# Patient Record
Sex: Female | Born: 1955
Health system: Southern US, Community
[De-identification: ages and names within clinical notes are randomized; demographics above are authoritative.]

## PROBLEM LIST (undated history)

## (undated) DIAGNOSIS — M51369 Other intervertebral disc degeneration, lumbar region without mention of lumbar back pain or lower extremity pain: Secondary | ICD-10-CM

## (undated) DIAGNOSIS — M549 Dorsalgia, unspecified: Secondary | ICD-10-CM

## (undated) DIAGNOSIS — K219 Gastro-esophageal reflux disease without esophagitis: Secondary | ICD-10-CM

## (undated) DIAGNOSIS — M5136 Other intervertebral disc degeneration, lumbar region: Secondary | ICD-10-CM

## (undated) DIAGNOSIS — Z923 Personal history of irradiation: Secondary | ICD-10-CM

## (undated) DIAGNOSIS — F419 Anxiety disorder, unspecified: Secondary | ICD-10-CM

## (undated) DIAGNOSIS — C50119 Malignant neoplasm of central portion of unspecified female breast: Secondary | ICD-10-CM

## (undated) DIAGNOSIS — G8929 Other chronic pain: Secondary | ICD-10-CM

## (undated) DIAGNOSIS — J45909 Unspecified asthma, uncomplicated: Secondary | ICD-10-CM

## (undated) HISTORY — PX: BLADDER SUSPENSION: SHX72

## (undated) HISTORY — PX: ABDOMINAL HYSTERECTOMY: SHX81

## (undated) HISTORY — DX: Unspecified asthma, uncomplicated: J45.909

## (undated) HISTORY — DX: Other intervertebral disc degeneration, lumbar region: M51.36

## (undated) HISTORY — DX: Malignant neoplasm of central portion of unspecified female breast: C50.119

## (undated) HISTORY — DX: Gastro-esophageal reflux disease without esophagitis: K21.9

## (undated) HISTORY — PX: ENDOMETRIAL ABLATION: SHX621

## (undated) HISTORY — DX: Other intervertebral disc degeneration, lumbar region without mention of lumbar back pain or lower extremity pain: M51.369

---

## 1999-11-03 ENCOUNTER — Other Ambulatory Visit: Admission: RE | Admit: 1999-11-03 | Discharge: 1999-11-03 | Payer: Self-pay | Admitting: Gynecology

## 1999-12-08 ENCOUNTER — Encounter: Admission: RE | Admit: 1999-12-08 | Discharge: 1999-12-08 | Payer: Self-pay | Admitting: Gynecology

## 1999-12-08 ENCOUNTER — Encounter: Payer: Self-pay | Admitting: Gynecology

## 2000-01-02 ENCOUNTER — Other Ambulatory Visit: Admission: RE | Admit: 2000-01-02 | Discharge: 2000-01-02 | Payer: Self-pay | Admitting: Gynecology

## 2000-01-22 ENCOUNTER — Other Ambulatory Visit: Admission: RE | Admit: 2000-01-22 | Discharge: 2000-01-22 | Payer: Self-pay | Admitting: Gynecology

## 2000-01-22 ENCOUNTER — Encounter (INDEPENDENT_AMBULATORY_CARE_PROVIDER_SITE_OTHER): Payer: Self-pay | Admitting: Specialist

## 2000-11-20 ENCOUNTER — Other Ambulatory Visit: Admission: RE | Admit: 2000-11-20 | Discharge: 2000-11-20 | Payer: Self-pay | Admitting: Gynecology

## 2000-12-23 ENCOUNTER — Encounter: Payer: Self-pay | Admitting: Gynecology

## 2000-12-23 ENCOUNTER — Encounter: Admission: RE | Admit: 2000-12-23 | Discharge: 2000-12-23 | Payer: Self-pay | Admitting: Gynecology

## 2001-05-29 ENCOUNTER — Other Ambulatory Visit: Admission: RE | Admit: 2001-05-29 | Discharge: 2001-05-29 | Payer: Self-pay | Admitting: Gynecology

## 2001-09-16 ENCOUNTER — Encounter: Payer: Self-pay | Admitting: *Deleted

## 2001-09-16 ENCOUNTER — Encounter: Admission: RE | Admit: 2001-09-16 | Discharge: 2001-09-16 | Payer: Self-pay | Admitting: *Deleted

## 2001-12-11 ENCOUNTER — Ambulatory Visit (HOSPITAL_BASED_OUTPATIENT_CLINIC_OR_DEPARTMENT_OTHER): Admission: RE | Admit: 2001-12-11 | Discharge: 2001-12-11 | Payer: Self-pay | Admitting: Gynecology

## 2001-12-11 ENCOUNTER — Encounter (INDEPENDENT_AMBULATORY_CARE_PROVIDER_SITE_OTHER): Payer: Self-pay | Admitting: Specialist

## 2001-12-24 ENCOUNTER — Encounter: Payer: Self-pay | Admitting: Gynecology

## 2001-12-24 ENCOUNTER — Encounter: Admission: RE | Admit: 2001-12-24 | Discharge: 2001-12-24 | Payer: Self-pay | Admitting: Gynecology

## 2002-10-19 ENCOUNTER — Other Ambulatory Visit: Admission: RE | Admit: 2002-10-19 | Discharge: 2002-10-19 | Payer: Self-pay | Admitting: Gynecology

## 2002-12-28 ENCOUNTER — Encounter: Admission: RE | Admit: 2002-12-28 | Discharge: 2002-12-28 | Payer: Self-pay | Admitting: Gynecology

## 2002-12-28 ENCOUNTER — Encounter: Payer: Self-pay | Admitting: Gynecology

## 2003-12-29 ENCOUNTER — Encounter: Admission: RE | Admit: 2003-12-29 | Discharge: 2003-12-29 | Payer: Self-pay | Admitting: Gynecology

## 2004-05-15 ENCOUNTER — Other Ambulatory Visit: Admission: RE | Admit: 2004-05-15 | Discharge: 2004-05-15 | Payer: Self-pay | Admitting: Gynecology

## 2004-06-06 ENCOUNTER — Encounter: Admission: RE | Admit: 2004-06-06 | Discharge: 2004-07-03 | Payer: Self-pay | Admitting: Orthopedic Surgery

## 2004-08-14 ENCOUNTER — Ambulatory Visit (HOSPITAL_COMMUNITY): Admission: RE | Admit: 2004-08-14 | Discharge: 2004-08-14 | Payer: Self-pay | Admitting: Chiropractic Medicine

## 2005-01-19 ENCOUNTER — Encounter: Admission: RE | Admit: 2005-01-19 | Discharge: 2005-01-19 | Payer: Self-pay | Admitting: Gynecology

## 2005-09-27 ENCOUNTER — Other Ambulatory Visit: Admission: RE | Admit: 2005-09-27 | Discharge: 2005-09-27 | Payer: Self-pay | Admitting: Gynecology

## 2006-01-21 ENCOUNTER — Encounter: Admission: RE | Admit: 2006-01-21 | Discharge: 2006-01-21 | Payer: Self-pay | Admitting: Gynecology

## 2006-07-31 ENCOUNTER — Other Ambulatory Visit: Admission: RE | Admit: 2006-07-31 | Discharge: 2006-07-31 | Payer: Self-pay | Admitting: Gynecology

## 2007-01-28 ENCOUNTER — Encounter: Admission: RE | Admit: 2007-01-28 | Discharge: 2007-01-28 | Payer: Self-pay | Admitting: Gynecology

## 2009-01-14 ENCOUNTER — Encounter: Admission: RE | Admit: 2009-01-14 | Discharge: 2009-01-14 | Payer: Self-pay | Admitting: Internal Medicine

## 2010-02-28 ENCOUNTER — Encounter: Admission: RE | Admit: 2010-02-28 | Discharge: 2010-02-28 | Payer: Self-pay | Admitting: Gynecology

## 2010-10-08 ENCOUNTER — Encounter: Payer: Self-pay | Admitting: Internal Medicine

## 2011-01-22 ENCOUNTER — Ambulatory Visit (HOSPITAL_BASED_OUTPATIENT_CLINIC_OR_DEPARTMENT_OTHER)
Admission: RE | Admit: 2011-01-22 | Discharge: 2011-01-23 | Disposition: A | Discharge status: Home / Self Care | Payer: BC Managed Care – PPO | Source: Ambulatory Visit | Attending: Gynecology | Admitting: Gynecology

## 2011-01-22 ENCOUNTER — Other Ambulatory Visit: Payer: Self-pay | Admitting: Gynecology

## 2011-01-22 DIAGNOSIS — Z79899 Other long term (current) drug therapy: Secondary | ICD-10-CM | POA: Insufficient documentation

## 2011-01-22 DIAGNOSIS — R110 Nausea: Secondary | ICD-10-CM | POA: Insufficient documentation

## 2011-01-22 DIAGNOSIS — N8189 Other female genital prolapse: Secondary | ICD-10-CM | POA: Insufficient documentation

## 2011-01-22 DIAGNOSIS — N815 Vaginal enterocele: Secondary | ICD-10-CM | POA: Insufficient documentation

## 2011-01-22 DIAGNOSIS — N814 Uterovaginal prolapse, unspecified: Secondary | ICD-10-CM | POA: Insufficient documentation

## 2011-01-22 DIAGNOSIS — Z01812 Encounter for preprocedural laboratory examination: Secondary | ICD-10-CM | POA: Insufficient documentation

## 2011-01-22 LAB — DIFFERENTIAL
Basophils Absolute: 0 10*3/uL (ref 0.0–0.1)
Eosinophils Relative: 0 % (ref 0–5)
Lymphocytes Relative: 5 % — ABNORMAL LOW (ref 12–46)
Neutro Abs: 10.9 10*3/uL — ABNORMAL HIGH (ref 1.7–7.7)

## 2011-01-22 LAB — CBC
HCT: 36.7 % (ref 36.0–46.0)
Hemoglobin: 13.1 g/dL (ref 12.0–15.0)
MCH: 30.7 pg (ref 26.0–34.0)
MCHC: 35.7 g/dL (ref 30.0–36.0)
Platelets: 326 10*3/uL (ref 150–400)

## 2011-01-22 LAB — POCT HEMOGLOBIN-HEMACUE: Hemoglobin: 14.3 g/dL (ref 12.0–15.0)

## 2011-02-02 NOTE — Op Note (Signed)
[facility]  Patient:    Stephanie Malone, Stephanie Malone Visit Number: [id number] MRN: [id number]          Service Type: NES Location: [facility] Attending Physician:  Katrina Stack Dictated by:   Gretta Cool, M.D. Proc. Date: 12/11/01 Admit Date:  12/11/2001                             Operative Report  PREOPERATIVE DIAGNOSIS:  Abdominal ultrasound with polyp versus fibroid luminal lesion.  POSTOPERATIVE DIAGNOSIS:  Submucous leiomyoma with abdominal ultrasound.  PROCEDURES: 1. Hysteroscopy. 2. Resection of an anterior fundal fibroid. 3. Total endometrial ablation by resection. 4. VaporTrode.  SURGEON:  Gretta Cool, M.D.  ANESTHESIA:  MAC plus paracervical block.  DESCRIPTION OF PROCEDURE:  Under excellent MAC anesthesia with the patient prepped and draped in Allen stirrups, a weighted speculum was placed in the vagina and the cervix grasped with a single-tooth tenaculum.  It was progressively dilated with a series of Pratt dilators to accommodate a 7 mm resectoscope.  The entire cavity was then examined and photographed.  After identification of what appeared to be an anterior wall fibroid at the apex of the fundus, the decision was made to resect it if at all possible.  There was one other small polypoid-looking structure near the left corneal area.  The bulging into the cavity was incised with the 90 degree loop and identified as a fibroid that could easily be removed by hysteroscopy.  It was resected out of the capsule.  The entire endometrial cavity was then symptomatically treated by resection and then by VaporTrode so as to eliminate any viable islands of endocardial tissue.  At the end of the procedure, the cavity was felt to have been optimally resected.  There was no evidence of deep adenomyosis or other lesions bulging into the cavity.  At this point, the procedure was terminated without complication.  The patient returned to  the recovery room in excellent condition. Dictated by:   Gretta Cool, M.D. Attending Physician:  Katrina Stack DD:  12/11/01 TD:  12/11/01 Job: [id number]

## 2011-03-02 NOTE — Op Note (Signed)
[name], Malone                 ACCOUNT NO.:  [id number]  MEDICAL RECORD NO.:  [id number]          PATIENT TYPE:  LOCATION:                                 FACILITY:  PHYSICIAN:  Gretta Cool, M.D. DATE OF BIRTH:  [birth year]  DATE OF PROCEDURE:  01/22/2011 DATE OF DISCHARGE:                              OPERATIVE REPORT   PREOPERATIVE DIAGNOSIS:  Pelvic organ prolapse with uterine prolapse grade 3 with rectocele and enterocele grade 3.  POSTOPERATIVE DIAGNOSIS:  Pelvic organ prolapse with uterine prolapse grade 3 with rectocele and enterocele grade 3.  PROCEDURE:  Vaginal hysterectomy posterior and enterocele repairs, cardinal uterosacral colposuspension.  SURGEON:  Gretta Cool, M.D.  ASSISTANT:  Lodema Hong.  ANESTHESIA:  General orotracheal.  DESCRIPTION OF PROCEDURE:  Under excellent anesthesia by LMA, this patient was prepped and draped in Allen stirrups in the lithotomy position.  The Foley catheter was placed in her bladder for drainage. The weighted speculum was then placed in the posterior vagina.  The cervix was infiltrated with Xylocaine with epinephrine.  The mucosa was then incised circumferentially around the cervix.  The mucosa was then pushed back off the lower uterine segment.  The cul-de-sac was then entered by [facility] scissors.  Note, the cervix was exceedingly elongated. The long retractor was then placed in the cul-de-sac for better visualization and the cardinal, then uterosacral ligaments were progressively clamped, cut, sutured and tied with 0 Vicryl.  At this point, the vesicovaginal plica was identified and opened and a Deaver placed beneath the bladder.  The uterine vessels were then clamped, cut, sutured and tied with 0 Vicryl.  At this point, the upper pedicles were clamped, cut, sutured and tied with 0 Vicryl.  The uterus was then inverted and the adnexal pedicles clamped across with Heaney clamps. The uterus was then excised.  The  pedicles were sutured with 0 Vicryl and then doubly ligated with free tie of 0 Vicryl.  At this point, the vaginal cuff was closed with a pursestring suture of 0 Novafil.  The cardinal and uterosacral ligaments were then secured to the vaginal cuff with interrupted sutures of 2-0 Novafil.  The stumps of the cardinal and uterosacral were already together in the midline.  They were plicated together end-to-end with 0 Vicryl.  The cuff was then approximated with interrupted sutures of 0 Vicryl as well.  At this point, attention was turned to the posterior repair.  The mucosa was infiltrated with Xylocaine with epinephrine.  The mucosa was then incised and then dissected from the perirectal fascia.  The cut edges were clamped with Allis clamps.  At this point, the perirectal fascia was dissected from the mucosa and the incision carried to the apex of the vaginal cuff.  At this point, the cardinal uterosacral ligaments were again identified and the cardinal uterosacral fixed to the detached perirectal fascia.  The perirectal fascia was then secured again to the cardinal uterosacral complex and to the posterior vaginal wall.  Once the fascia was resuspended, it was plicated in the midline with a running suture of #0 Vicryl from the apex of  the vagina to the introitus.  The mucosa was then trimmed and the upper layers of perirectal fascia and mucosa closed with a subcuticular closure from the apex of the vaginal cuff to the introitus.  Perineal body muscles were approximated in the midline and the procedure then terminated without complication.  The patient returned to the Recovery in excellent condition.          ______________________________ Gretta Cool, M.D.     CWL/MEDQ  D:  01/22/2011  T:  01/22/2011  Job:  [id number]  cc:   Gretta Cool, M.D. Fax: [phone]  Larina Earthly, M.D. Fax: [phone]  Electronically Signed by Beather Arbour M.D. on 03/02/2011 11:50:25 AM

## 2011-03-23 ENCOUNTER — Other Ambulatory Visit: Payer: Self-pay | Admitting: Internal Medicine

## 2011-03-23 DIAGNOSIS — R109 Unspecified abdominal pain: Secondary | ICD-10-CM

## 2011-03-27 ENCOUNTER — Ambulatory Visit
Admission: RE | Admit: 2011-03-27 | Discharge: 2011-03-27 | Disposition: A | Discharge status: Home / Self Care | Payer: BC Managed Care – PPO | Source: Ambulatory Visit | Attending: Internal Medicine | Admitting: Internal Medicine

## 2011-03-27 ENCOUNTER — Other Ambulatory Visit: Payer: Self-pay | Admitting: Gynecology

## 2011-03-27 DIAGNOSIS — Z1231 Encounter for screening mammogram for malignant neoplasm of breast: Secondary | ICD-10-CM

## 2011-03-27 DIAGNOSIS — N6459 Other signs and symptoms in breast: Secondary | ICD-10-CM

## 2011-03-27 DIAGNOSIS — R109 Unspecified abdominal pain: Secondary | ICD-10-CM

## 2011-04-10 ENCOUNTER — Ambulatory Visit: Payer: BC Managed Care – PPO

## 2011-06-05 ENCOUNTER — Ambulatory Visit: Payer: BC Managed Care – PPO

## 2011-06-21 ENCOUNTER — Ambulatory Visit
Admission: RE | Admit: 2011-06-21 | Discharge: 2011-06-21 | Disposition: A | Discharge status: Home / Self Care | Payer: BC Managed Care – PPO | Source: Ambulatory Visit | Attending: Gynecology | Admitting: Gynecology

## 2011-06-21 DIAGNOSIS — Z1231 Encounter for screening mammogram for malignant neoplasm of breast: Secondary | ICD-10-CM

## 2011-07-09 ENCOUNTER — Ambulatory Visit
Admission: RE | Admit: 2011-07-09 | Discharge: 2011-07-09 | Disposition: A | Discharge status: Home / Self Care | Payer: BC Managed Care – PPO | Source: Ambulatory Visit | Attending: Gynecology | Admitting: Gynecology

## 2011-07-09 ENCOUNTER — Other Ambulatory Visit: Payer: Self-pay | Admitting: Gynecology

## 2011-07-09 DIAGNOSIS — N6310 Unspecified lump in the right breast, unspecified quadrant: Secondary | ICD-10-CM

## 2011-07-09 DIAGNOSIS — N6459 Other signs and symptoms in breast: Secondary | ICD-10-CM

## 2011-07-10 ENCOUNTER — Ambulatory Visit
Admission: RE | Admit: 2011-07-10 | Discharge: 2011-07-10 | Disposition: A | Discharge status: Home / Self Care | Payer: BC Managed Care – PPO | Source: Ambulatory Visit | Attending: Gynecology | Admitting: Gynecology

## 2011-07-10 DIAGNOSIS — C50119 Malignant neoplasm of central portion of unspecified female breast: Secondary | ICD-10-CM

## 2011-07-10 DIAGNOSIS — N6310 Unspecified lump in the right breast, unspecified quadrant: Secondary | ICD-10-CM

## 2011-07-10 DIAGNOSIS — C50111 Malignant neoplasm of central portion of right female breast: Secondary | ICD-10-CM | POA: Insufficient documentation

## 2011-07-10 HISTORY — DX: Malignant neoplasm of central portion of unspecified female breast: C50.119

## 2011-07-11 ENCOUNTER — Other Ambulatory Visit: Payer: Self-pay | Admitting: Gynecology

## 2011-07-11 DIAGNOSIS — C50911 Malignant neoplasm of unspecified site of right female breast: Secondary | ICD-10-CM

## 2011-07-13 ENCOUNTER — Ambulatory Visit
Admission: RE | Admit: 2011-07-13 | Discharge: 2011-07-13 | Disposition: A | Discharge status: Home / Self Care | Payer: BC Managed Care – PPO | Source: Ambulatory Visit | Attending: Gynecology | Admitting: Gynecology

## 2011-07-13 DIAGNOSIS — C50911 Malignant neoplasm of unspecified site of right female breast: Secondary | ICD-10-CM

## 2011-07-13 MED ORDER — GADOBENATE DIMEGLUMINE 529 MG/ML IV SOLN
15.0000 mL | Freq: Once | INTRAVENOUS | Status: AC | PRN
  Administered 2011-07-13: 15 mL via INTRAVENOUS

## 2011-07-17 ENCOUNTER — Encounter (INDEPENDENT_AMBULATORY_CARE_PROVIDER_SITE_OTHER): Payer: Self-pay | Admitting: Surgery

## 2011-07-18 ENCOUNTER — Ambulatory Visit (HOSPITAL_BASED_OUTPATIENT_CLINIC_OR_DEPARTMENT_OTHER): Payer: BC Managed Care – PPO | Admitting: Surgery

## 2011-07-18 ENCOUNTER — Other Ambulatory Visit: Payer: Self-pay | Admitting: Oncology

## 2011-07-18 ENCOUNTER — Encounter (INDEPENDENT_AMBULATORY_CARE_PROVIDER_SITE_OTHER): Payer: Self-pay | Admitting: Surgery

## 2011-07-18 ENCOUNTER — Encounter (HOSPITAL_BASED_OUTPATIENT_CLINIC_OR_DEPARTMENT_OTHER): Payer: BC Managed Care – PPO | Admitting: Oncology

## 2011-07-18 ENCOUNTER — Ambulatory Visit: Payer: BC Managed Care – PPO | Attending: Surgery | Admitting: Physical Therapy

## 2011-07-18 VITALS — BP 126/81 | HR 76 | Temp 98.4°F | Resp 20 | Ht 65.5 in | Wt 175.7 lb

## 2011-07-18 DIAGNOSIS — C50019 Malignant neoplasm of nipple and areola, unspecified female breast: Secondary | ICD-10-CM

## 2011-07-18 DIAGNOSIS — M545 Low back pain, unspecified: Secondary | ICD-10-CM | POA: Insufficient documentation

## 2011-07-18 DIAGNOSIS — C50919 Malignant neoplasm of unspecified site of unspecified female breast: Secondary | ICD-10-CM | POA: Insufficient documentation

## 2011-07-18 DIAGNOSIS — IMO0001 Reserved for inherently not codable concepts without codable children: Secondary | ICD-10-CM | POA: Insufficient documentation

## 2011-07-18 DIAGNOSIS — C50119 Malignant neoplasm of central portion of unspecified female breast: Secondary | ICD-10-CM

## 2011-07-18 DIAGNOSIS — M25619 Stiffness of unspecified shoulder, not elsewhere classified: Secondary | ICD-10-CM | POA: Insufficient documentation

## 2011-07-18 LAB — CBC WITH DIFFERENTIAL/PLATELET
Basophils Absolute: 0 10*3/uL (ref 0.0–0.1)
EOS%: 2.6 % (ref 0.0–7.0)
Eosinophils Absolute: 0.1 10*3/uL (ref 0.0–0.5)
HGB: 14.1 g/dL (ref 11.6–15.9)
MONO#: 0.3 10*3/uL (ref 0.1–0.9)
NEUT#: 3.8 10*3/uL (ref 1.5–6.5)
RDW: 12.8 % (ref 11.2–14.5)
lymph#: 1.5 10*3/uL (ref 0.9–3.3)

## 2011-07-18 LAB — COMPREHENSIVE METABOLIC PANEL
AST: 21 U/L (ref 0–37)
Albumin: 4.1 g/dL (ref 3.5–5.2)
BUN: 11 mg/dL (ref 6–23)
CO2: 27 mEq/L (ref 19–32)
Calcium: 9.6 mg/dL (ref 8.4–10.5)
Chloride: 100 mEq/L (ref 96–112)
Glucose, Bld: 101 mg/dL — ABNORMAL HIGH (ref 70–99)
Potassium: 3.9 mEq/L (ref 3.5–5.3)

## 2011-07-18 NOTE — Progress Notes (Signed)
NAME: Stephanie Malone                                                                                      DOB: 11/18/1955 DATE: 07/18/2011               MRN: [id number]   CC:  Chief Complaint  Patient presents with  . Breast Cancer    HPI:  Stephanie Malone is a 55 y.o.  female who presents with A newly diagnosed right breast cancer about 4 months ago she noticed some slight lateral nipple inversion. She did not feel a mass. She recently had a mammogram and a 1.5 cm mass was found. A biopsy was done. It showed invasive ductal carcinoma receptor positive, Ki-67 of 3%and a HER-2/neu that was negative. MRI shows only the single lesion. There is no evidence of metastatic disease.  PMH:   has a past medical history of Breast cancer, IDC, Right, ER+,PR+, HER2- (07/10/2011); Migraine; and Degenerative lumbar disc.  PSH:   has no past surgical history on file.  ALLERGIES:  No Known Allergies  MEDICATIONS:   Current Outpatient Prescriptions  Medication Sig Dispense Refill  . ALPRAZolam (XANAX) 0.5 MG tablet Take 0.5 mg by mouth at bedtime as needed.        . SUMAtriptan (IMITREX) 25 MG tablet Take 25 mg by mouth every 2 (two) hours as needed.        . traZODone (DESYREL) 100 MG tablet Take 100 mg by mouth at bedtime.          ROS: Her 12 point review of systems form was filled out and was completely negative.  EXAM:   GENERAL:  The patient is alert, oriented, and generally healthy-appearing, NAD. Mood and affect are normal.  HEENT:  The head is normocephalic, the eyes nonicteric, the pupils were round regular and equal. EOMs are normal. Pharynx normal. Dentition good.  NECK:  The neck is supple and there are no masses or thyromegaly.  LUNGS: Normal respirations and clear to auscultation.  HEART: Regular rhythm, with no murmurs rubs or gallops. Pulses are intact carotid dorsalis pedis and posterior tibial. No significant varicosities are noted.  BREASTS:  there is mild inversion of  the lateral aspect of the right nipple. There is no palpable mass. The skin of the nipple looks normal. The rest are otherwise asymptomatic and normal to palpation.  LYMPHATICS: There is no axillary or supraclavicular adenopathy noted.  ABDOMEN: Soft, flat, and nontender. No masses or organomegaly is noted. No hernias are noted. Bowel sounds are normal.  EXTREMITIES:  Good range of motion, no edema.   DATA REVIEWED:   her mammogram and MRI films and reports were viewed as well as the pathology slides and reports.5  IMPRESSION:  Clinical stage I right breast cancer subareolar with nipple inversion  PLAN:   I think she would benefit from a right partial mastectomy but unfortunately this would require. excision of the nipple areolar complex. I did discuss with her the potential of trying to do a lumpectomy and spare the nipple but that I was concerned that was a nipple inversion there is going to   be involvement.we also discussed the need for a sentinel node evaluation. I gave her a copy of her pathology report I have discussed the indications for the lumpectomy and described the procedure. She understand that the chance of removal of the abnormal area is very good, but that occasionally we are unable to locate it and may have to do a second procedure. We also discussed the possibility of a second procedure to get additional tissue. Risks of surgery such as bleeding and infection have also been explained, as well as the implications of not doing the surgery. She understands and wishes to proceed.      

## 2011-07-18 NOTE — Patient Instructions (Signed)
We will schedule you for a lumpectomy and sentinel node evaluation for the right breast cancer. We will need to remove the nipple and areolar area as we discussed.call my office if you have any questions.

## 2011-07-20 ENCOUNTER — Other Ambulatory Visit (INDEPENDENT_AMBULATORY_CARE_PROVIDER_SITE_OTHER): Payer: Self-pay | Admitting: Surgery

## 2011-07-20 DIAGNOSIS — C50911 Malignant neoplasm of unspecified site of right female breast: Secondary | ICD-10-CM

## 2011-07-23 ENCOUNTER — Ambulatory Visit (INDEPENDENT_AMBULATORY_CARE_PROVIDER_SITE_OTHER): Payer: BC Managed Care – PPO | Admitting: Family Medicine

## 2011-07-23 ENCOUNTER — Other Ambulatory Visit (INDEPENDENT_AMBULATORY_CARE_PROVIDER_SITE_OTHER): Payer: Self-pay | Admitting: Surgery

## 2011-07-23 VITALS — BP 120/70 | Ht 65.0 in | Wt 175.0 lb

## 2011-07-23 DIAGNOSIS — M999 Biomechanical lesion, unspecified: Secondary | ICD-10-CM

## 2011-07-23 DIAGNOSIS — M533 Sacrococcygeal disorders, not elsewhere classified: Secondary | ICD-10-CM

## 2011-07-24 ENCOUNTER — Encounter: Payer: Self-pay | Admitting: Family Medicine

## 2011-07-24 ENCOUNTER — Telehealth: Payer: Self-pay | Admitting: *Deleted

## 2011-07-24 ENCOUNTER — Telehealth: Payer: Self-pay | Admitting: Family Medicine

## 2011-07-24 DIAGNOSIS — M533 Sacrococcygeal disorders, not elsewhere classified: Secondary | ICD-10-CM | POA: Insufficient documentation

## 2011-07-24 NOTE — Telephone Encounter (Signed)
Message copied by Nestor Ramp on Tue Jul 24, 2011 12:22 PM ------      Message from: Lizbeth Bark      Created: Tue Jul 24, 2011 10:08 AM      Regarding: PHONE MESSAGE       Patient called and wanted me to pass a message along to you.      She said thank you so much, the injection has helped her so much. She was seen in the [facility] office yesterday 11/6

## 2011-07-24 NOTE — Telephone Encounter (Signed)
Great! Stephanie Malone

## 2011-07-24 NOTE — Telephone Encounter (Signed)
Message copied by Gerre Scull on Tue Jul 24, 2011  3:40 PM ------      Message from: Pershing Proud      Created: Tue Jul 24, 2011  1:58 PM      Regarding: [place]       Hi ladies,            Please r/s pt for 2 weeks after surgery on 11/28 with Dr. Mickel Crow.  Current appt on 12/5.  She will not have results from oncotype until 2 weeks after surgery.            Thanks,      [place]

## 2011-07-24 NOTE — Telephone Encounter (Signed)
CALLED PATIENT ON CELL PHONE AND INFORMED PATIENT OF THE NEW DATE AND TIME ON 08-29-2011.

## 2011-07-24 NOTE — Progress Notes (Signed)
CC:   Stephanie Malone, M.D. Stephanie Malone, M.D. Stephanie Malone, Ph.D., M.D. Stephanie Bang, MD Stephanie Malone, M.D.  Stephanie Malone is a 55 year old [place] woman referred by Dr. Jean Malone for evaluation and treatment in the setting of newly diagnosed breast cancer.  The patient had an unremarkable screening mammogram in June of 2011 at the [facility].  However, some time ago she noted what looked to her like nipple inversion.  She brought this to Dr. Vicente Malone attention and he set her up for diagnostic mammography at the [facility], July 09, 2011.  This showed a heterogeneously dense breast pattern and new mild inversion of the right nipple.  There was also subtle distortion in the lateral subareolar portion of the right breast. Mammography also showed a small nodule medially within the right breast which was an interval change.  The left breast was unremarkable.  On physical exam, Dr. Jean Malone noted the nipple inversion but no palpable mass and no palpable right axillary adenopathy.  Ultrasonography showed an irregularly marginated hypoechoic mass within the subareolar portion of the right breast at the 9 o'clock position measuring 1.3 cm.  There was also an intramammary lymph node with asymmetrical cortical thickening at the 4 o'clock position in the right breast.  This measured 6 mm.  The ultrasound of the axilla was unremarkable.  With this information, ultrasound-guided core biopsy of the right breast mass was performed.  On the initial pass, the mass collapsed consistent with a complicated cyst.  The pathology from this procedure ([id number]) showed an invasive mammary carcinoma, grade 1 which was strongly estrogen and progesterone receptor positive at 99% and 100% respectively.  The MIB-1 was 3%.  There was no amplification of HER-2 by CISH, the ratio being 1.43.  With this information, the patient was referred to Dr. Jamey Malone and bilateral breast MRIs were  obtained July 13, 2011.  This again showed the right lateral sub areolar mass measuring 1.5 cm.  The other area of the right breast, which had also been previously biopsied, had been shown to be benign. With this information, the patient presents to discuss her overall treatment options and plan.  PAST MEDICAL HISTORY:  Significant for migraines and degenerative disk disease.  She is status post pelvic floor repair and Burch procedure in 1997 and hysterectomy with further pelvic floor repair in May of 2012. This was a simple hysterectomy with no salpingo-oophorectomy and benign pathology ([id number] [id number]).  FAMILY HISTORY:  The patient's father is alive at age 63.  The patient's mother died at age 13.  She had 1 brother, no sisters.  There is no history of breast or ovarian cancer in the family.  The father's mother may have had multiple myeloma.  The father himself has a history of hepatocellular cancer diagnosed a year ago, and 1 of the patient's father's brothers had pancreatic cancer.  GYNECOLOGIC HISTORY:  She had menarche age 88.  Last menstrual period was February of this year.  She has been using hormone replacement for the past 2 years but just stopped this week.  She is GX, P3, 1st pregnancy to term at age 60, which she understands is a risk factor for breast cancer.  The patient used oral contraceptives for approximately 5 years.  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  Her husband, Stephanie Malone, is present today.  Their sons are Stephanie Malone, a 57 year old, lives in [place] and is a Consulting civil engineer; Stephanie Malone,  18, attends [employer] as a Consulting civil engineer; and Stephanie Malone is at home where she, of course, goes to high school.  HEALTH MAINTENANCE:  The patient smoked minimally, perhaps as much as 5- pack-years.  She quit in 1995.  She does not use alcohol.  She had a colonoscopy in October of 2011, a bone density in 2010 which was normal (there is a copy in the chart), most recent  Pap smear September of 2011.  ALLERGIES:  No known drug allergies.  MEDICATIONS:  She takes Imitrex as needed, Phenergan as needed, trazodone at bedtime, and Xanax as needed.  REVIEW OF SYSTEMS:  She has some low back pain which is fairly chronic has not increased in intensity or frequency recently and does not limit her activities.  She has her history of migraine headaches, but otherwise a separately scanned detailed review of systems was noncontributory.  PHYSICAL EXAM:  Stephanie Malone is a middle-aged white woman who is 5 feet and 1/2 inches tall and weighs 176 pounds.  Temperature is 98.4, pulse 76, respiratory rate 20, and blood pressure 126/81.  Sclerae are not icteric.  Oropharynx is clear.  I do not palpate any peripheral adenopathy including in the right axilla.  Lungs:  No crackles or wheezes.  Heart:  Regular rate and rhythm.  No murmur appreciated. Breasts:  Right breast, there is some thickening in the sub areolar area but I could not give a measurement for this.  There is no really discrete mass.  The nipple retraction is noticeable as compared with the left.  The left side is entirely unremarkable.  Abdomen:  Benign. Musculoskeletal:  No focal spinal tenderness.  No peripheral edema. Neurologic:  Nonfocal.  LAB WORK:  Shows a completely normal CBC, C-MET and CA 27-29 which is non-informative.  Specifically, the white cell count is 5.8, hemoglobin 14.1, and platelets 305,000, creatinine 0.79, normal liver function tests and normal electrolytes.  FILMS:  In addition to the films already noted, the patient had an abdominal ultrasound in July of this year for evaluation of occasional abdominal pain.  There were no gallstones.  The liver had a normal echogenic pattern.  Overall this was a negative exam.  The patient had a head CT in December of 2002 which showed only chronic and acute sinusitis.  IMPRESSION AND PLAN:  A 55 year old [place] woman status post right breast  biopsy for a subareolar mass which measures 1.5 cm by MRI and which pathologically is an invasive ductal carcinoma, grade 1, strongly estrogen and progesterone receptor positive with no HER-2 amplification, and with an MIB-1 of 3%. We spent the better part of her hour visit orienting the patient to her diagnosis but this tumor types as a luminal A on the basis of her grade, very low proliferation fraction, and receptor pattern.  These tumors tend to have a good prognosis and tend to get very little or no benefit from chemotherapy.  She understands that once she has her definitive lumpectomy, we will have further information and I will be able to give her a more specific prognosis but at this point, I do not anticipate her needing chemotherapy.  The plan then is to proceed to lumpectomy and sentinel lymph node biopsy under Dr. Jamey Malone.  She will return to see me on December 5th.  At that point, very likely we will decide that she should proceed to radiation under Dr. Roselind Malone and then start tamoxifen.  She knows to call for any problems that may develop before that visit.  ______________________________ Lowella Dell, M.D. GCM/MEDQ  D:  07/20/2011  T:  07/23/2011  Job:  [id number]

## 2011-07-24 NOTE — Progress Notes (Signed)
  Subjective:    Patient ID: Stephanie Malone, female    DOB: June 18, 1956, 55 y.o.   MRN: [id number]  HPI  One month worsening low back pain. Has had prior issues with low back pain but the area of pain was different. This is more on left of buttock, does not radiate, no leg weakness, no incontinence. Pain 4-8 /10. Worse in last 1 week.  Aching in quality, keeping her from sitting or sleeping comfortably.  No specific injury or activity that preceeded. Symptoms.  PERTINENT  PMH / PSH: Just dx with breast cancer  Review of Systems Pertinent review of systems: see hpi and additionally negative for fever or unusual weight change.     Objective:   Physical Exam  Vital signs reviewed. GENERAL: Well developed, well nourished, no acute distress BACK: no defect, nontender to palpation and percusiion of vertebra in thoracic and lumbar area. No muscle spasm. Mild ttp left SI joint area. FABER painful on left, normal on right. SLR negative. +  Gaenslen's test (positive in both positions with pain on left), positive pelvic compression, negative distraction.tNo trendelenburg, normal gait. No  Leg legnth discrepancy. LE strength hip flexors and extension 5/5 symmetrical.  INJECTION: Patient was given informed consent, signed copy in the chart. Appropriate time out was taken. Area prepped and draped in usual sterile fashion. 1 cc of methylprednisolone 40 mg/ml plus  4 cc of 1% lidocaine without epinephrine was injected into the left SI joint  using a(n) posterior approach. The patient tolerated the procedure well. There were no complications. Post procedure instructions were given.     Assessment & Plan:  SI joint dysfunction / pain. Will set up PT CSI injection today. rtc prn

## 2011-07-25 ENCOUNTER — Encounter: Payer: Self-pay | Admitting: Family Medicine

## 2011-07-25 ENCOUNTER — Ambulatory Visit: Payer: BC Managed Care – PPO | Attending: Family Medicine | Admitting: Physical Therapy

## 2011-07-25 DIAGNOSIS — M25659 Stiffness of unspecified hip, not elsewhere classified: Secondary | ICD-10-CM | POA: Insufficient documentation

## 2011-07-25 DIAGNOSIS — IMO0001 Reserved for inherently not codable concepts without codable children: Secondary | ICD-10-CM | POA: Insufficient documentation

## 2011-07-25 DIAGNOSIS — M545 Low back pain, unspecified: Secondary | ICD-10-CM | POA: Insufficient documentation

## 2011-07-29 ENCOUNTER — Encounter: Payer: Self-pay | Admitting: *Deleted

## 2011-07-29 NOTE — Progress Notes (Signed)
Mailed after appt letter to pt. 

## 2011-07-30 ENCOUNTER — Other Ambulatory Visit (HOSPITAL_COMMUNITY): Payer: BC Managed Care – PPO

## 2011-08-02 ENCOUNTER — Other Ambulatory Visit (INDEPENDENT_AMBULATORY_CARE_PROVIDER_SITE_OTHER): Payer: Self-pay | Admitting: Surgery

## 2011-08-02 DIAGNOSIS — C50911 Malignant neoplasm of unspecified site of right female breast: Secondary | ICD-10-CM

## 2011-08-06 ENCOUNTER — Ambulatory Visit: Payer: BC Managed Care – PPO | Admitting: Physical Therapy

## 2011-08-07 ENCOUNTER — Other Ambulatory Visit: Payer: Self-pay

## 2011-08-07 ENCOUNTER — Encounter (HOSPITAL_BASED_OUTPATIENT_CLINIC_OR_DEPARTMENT_OTHER)
Admission: RE | Admit: 2011-08-07 | Discharge: 2011-08-07 | Disposition: A | Discharge status: Home / Self Care | Payer: BC Managed Care – PPO | Source: Ambulatory Visit | Attending: Surgery | Admitting: Surgery

## 2011-08-07 ENCOUNTER — Encounter (HOSPITAL_BASED_OUTPATIENT_CLINIC_OR_DEPARTMENT_OTHER): Payer: Self-pay | Admitting: *Deleted

## 2011-08-08 ENCOUNTER — Ambulatory Visit: Payer: BC Managed Care – PPO | Admitting: Physical Therapy

## 2011-08-13 ENCOUNTER — Ambulatory Visit: Payer: BC Managed Care – PPO | Admitting: Physical Therapy

## 2011-08-15 ENCOUNTER — Other Ambulatory Visit (HOSPITAL_COMMUNITY): Payer: BC Managed Care – PPO

## 2011-08-15 ENCOUNTER — Other Ambulatory Visit (INDEPENDENT_AMBULATORY_CARE_PROVIDER_SITE_OTHER): Payer: Self-pay | Admitting: Surgery

## 2011-08-15 ENCOUNTER — Encounter (HOSPITAL_BASED_OUTPATIENT_CLINIC_OR_DEPARTMENT_OTHER): Payer: Self-pay | Admitting: Anesthesiology

## 2011-08-15 ENCOUNTER — Other Ambulatory Visit: Payer: BC Managed Care – PPO | Admitting: Lab

## 2011-08-15 ENCOUNTER — Ambulatory Visit (HOSPITAL_COMMUNITY)
Admission: RE | Admit: 2011-08-15 | Discharge: 2011-08-15 | Disposition: A | Discharge status: Home / Self Care | Payer: BC Managed Care – PPO | Source: Ambulatory Visit | Attending: Surgery | Admitting: Surgery

## 2011-08-15 ENCOUNTER — Ambulatory Visit (HOSPITAL_BASED_OUTPATIENT_CLINIC_OR_DEPARTMENT_OTHER): Payer: BC Managed Care – PPO | Admitting: Anesthesiology

## 2011-08-15 ENCOUNTER — Encounter (HOSPITAL_BASED_OUTPATIENT_CLINIC_OR_DEPARTMENT_OTHER)
Admission: RE | Disposition: A | Discharge status: Home / Self Care | Payer: Self-pay | Source: Ambulatory Visit | Attending: Surgery

## 2011-08-15 ENCOUNTER — Ambulatory Visit: Payer: BC Managed Care – PPO | Admitting: Oncology

## 2011-08-15 ENCOUNTER — Ambulatory Visit (HOSPITAL_BASED_OUTPATIENT_CLINIC_OR_DEPARTMENT_OTHER)
Admission: RE | Admit: 2011-08-15 | Discharge: 2011-08-15 | Disposition: A | Discharge status: Home / Self Care | Payer: BC Managed Care – PPO | Source: Ambulatory Visit | Attending: Surgery | Admitting: Surgery

## 2011-08-15 DIAGNOSIS — M51379 Other intervertebral disc degeneration, lumbosacral region without mention of lumbar back pain or lower extremity pain: Secondary | ICD-10-CM | POA: Insufficient documentation

## 2011-08-15 DIAGNOSIS — Z170 Estrogen receptor positive status [ER+]: Secondary | ICD-10-CM | POA: Insufficient documentation

## 2011-08-15 DIAGNOSIS — G43909 Migraine, unspecified, not intractable, without status migrainosus: Secondary | ICD-10-CM | POA: Insufficient documentation

## 2011-08-15 DIAGNOSIS — Z01810 Encounter for preprocedural cardiovascular examination: Secondary | ICD-10-CM | POA: Insufficient documentation

## 2011-08-15 DIAGNOSIS — Z01812 Encounter for preprocedural laboratory examination: Secondary | ICD-10-CM | POA: Insufficient documentation

## 2011-08-15 DIAGNOSIS — C50119 Malignant neoplasm of central portion of unspecified female breast: Secondary | ICD-10-CM | POA: Insufficient documentation

## 2011-08-15 DIAGNOSIS — C50911 Malignant neoplasm of unspecified site of right female breast: Secondary | ICD-10-CM

## 2011-08-15 DIAGNOSIS — M5137 Other intervertebral disc degeneration, lumbosacral region: Secondary | ICD-10-CM | POA: Insufficient documentation

## 2011-08-15 HISTORY — DX: Dorsalgia, unspecified: M54.9

## 2011-08-15 HISTORY — PX: BREAST LUMPECTOMY: SHX2

## 2011-08-15 HISTORY — DX: Other chronic pain: G89.29

## 2011-08-15 HISTORY — PX: MASTECTOMY PARTIAL / LUMPECTOMY: SUR851

## 2011-08-15 HISTORY — DX: Anxiety disorder, unspecified: F41.9

## 2011-08-15 SURGERY — BREAST LUMPECTOMY WITH EXCISION OF SENTINEL NODE
Anesthesia: General | Site: Breast | Laterality: Right | Wound class: Clean

## 2011-08-15 MED ORDER — MORPHINE SULFATE 2 MG/ML IJ SOLN: 0.0500 mg/kg | INTRAMUSCULAR | Status: DC | PRN

## 2011-08-15 MED ORDER — HYDROMORPHONE HCL 2 MG PO TABS: 2.0000 mg | ORAL_TABLET | ORAL | Status: AC | PRN

## 2011-08-15 MED ORDER — FENTANYL CITRATE 0.05 MG/ML IJ SOLN
50.0000 ug | INTRAMUSCULAR | Status: DC | PRN
  Administered 2011-08-15: 50 ug via INTRAVENOUS

## 2011-08-15 MED ORDER — CEFAZOLIN SODIUM 1-5 GM-% IV SOLN
1.0000 g | INTRAVENOUS | Status: AC
  Administered 2011-08-15: 1 g via INTRAVENOUS

## 2011-08-15 MED ORDER — MIDAZOLAM HCL 2 MG/2ML IJ SOLN
0.5000 mg | INTRAMUSCULAR | Status: DC | PRN
  Administered 2011-08-15: 1 mg via INTRAVENOUS

## 2011-08-15 MED ORDER — DEXAMETHASONE SODIUM PHOSPHATE 4 MG/ML IJ SOLN
INTRAMUSCULAR | Status: DC | PRN
  Administered 2011-08-15: 10 mg via INTRAVENOUS

## 2011-08-15 MED ORDER — ACETAMINOPHEN 10 MG/ML IV SOLN
INTRAVENOUS | Status: DC | PRN
  Administered 2011-08-15: 1000 mg via INTRAVENOUS

## 2011-08-15 MED ORDER — BUPIVACAINE HCL (PF) 0.25 % IJ SOLN
INTRAMUSCULAR | Status: DC | PRN
  Administered 2011-08-15: 30 mL

## 2011-08-15 MED ORDER — METHYLENE BLUE 1 % INJ SOLN
INTRAMUSCULAR | Status: DC | PRN
  Administered 2011-08-15: 2 mL via SUBMUCOSAL

## 2011-08-15 MED ORDER — LACTATED RINGERS IV SOLN
INTRAVENOUS | Status: DC
  Administered 2011-08-15 (×3): via INTRAVENOUS

## 2011-08-15 MED ORDER — HYDROMORPHONE HCL 2 MG PO TABS
2.0000 mg | ORAL_TABLET | ORAL | Status: DC | PRN
  Administered 2011-08-15: 2 mg via ORAL

## 2011-08-15 MED ORDER — SODIUM CHLORIDE 0.9 % IJ SOLN
INTRAMUSCULAR | Status: DC | PRN
  Administered 2011-08-15: 3 mL

## 2011-08-15 MED ORDER — FENTANYL CITRATE 0.05 MG/ML IJ SOLN
INTRAMUSCULAR | Status: DC | PRN
  Administered 2011-08-15: 50 ug via INTRAVENOUS
  Administered 2011-08-15: 100 ug via INTRAVENOUS
  Administered 2011-08-15: 50 ug via INTRAVENOUS

## 2011-08-15 MED ORDER — PROPOFOL 10 MG/ML IV EMUL
INTRAVENOUS | Status: DC | PRN
  Administered 2011-08-15: 200 mg via INTRAVENOUS

## 2011-08-15 MED ORDER — ONDANSETRON HCL 4 MG/2ML IJ SOLN
INTRAMUSCULAR | Status: DC | PRN
  Administered 2011-08-15: 4 mg via INTRAVENOUS

## 2011-08-15 MED ORDER — PROMETHAZINE HCL 25 MG/ML IJ SOLN: 6.2500 mg | INTRAMUSCULAR | Status: DC | PRN

## 2011-08-15 MED ORDER — LIDOCAINE HCL (CARDIAC) 20 MG/ML IV SOLN
INTRAVENOUS | Status: DC | PRN
  Administered 2011-08-15: 50 mg via INTRAVENOUS

## 2011-08-15 MED ORDER — TECHNETIUM TC 99M SULFUR COLLOID FILTERED
1.0000 | Freq: Once | INTRAVENOUS | Status: AC | PRN
  Administered 2011-08-15: 1 via INTRADERMAL

## 2011-08-15 MED ORDER — DROPERIDOL 2.5 MG/ML IJ SOLN
INTRAMUSCULAR | Status: DC | PRN
  Administered 2011-08-15: 0.625 mg via INTRAVENOUS

## 2011-08-15 MED ORDER — HYDROMORPHONE HCL PF 1 MG/ML IJ SOLN: 0.2500 mg | INTRAMUSCULAR | Status: DC | PRN

## 2011-08-15 MED ORDER — MEPERIDINE HCL 25 MG/ML IJ SOLN
6.2500 mg | INTRAMUSCULAR | Status: DC | PRN
Start: 2011-08-15 — End: 2011-08-15

## 2011-08-15 SURGICAL SUPPLY — 59 items
ADH SKN CLS APL DERMABOND .7 (GAUZE/BANDAGES/DRESSINGS) ×1
APPLIER CLIP 11 MED OPEN (CLIP) ×2
APPLIER CLIP 9.375 MED OPEN (MISCELLANEOUS)
APR CLP MED 11 20 MLT OPN (CLIP) ×1
APR CLP MED 9.3 20 MLT OPN (MISCELLANEOUS)
BINDER BREAST LRG (GAUZE/BANDAGES/DRESSINGS) ×1 IMPLANT
BLADE HEX COATED 2.75 (ELECTRODE) ×2 IMPLANT
BLADE SURG 15 STRL LF DISP TIS (BLADE) ×2 IMPLANT
BLADE SURG 15 STRL SS (BLADE) ×2
CANISTER SUCTION 1200CC (MISCELLANEOUS) ×2 IMPLANT
CHLORAPREP W/TINT 26ML (MISCELLANEOUS) ×2 IMPLANT
CLIP APPLIE 11 MED OPEN (CLIP) IMPLANT
CLIP APPLIE 9.375 MED OPEN (MISCELLANEOUS) IMPLANT
CLIP TI MEDIUM 6 (CLIP) IMPLANT
CLIP TI WIDE RED SMALL 6 (CLIP) ×2 IMPLANT
CLOTH BEACON ORANGE TIMEOUT ST (SAFETY) ×2 IMPLANT
COVER KIT LFREE 14X147CM (MISCELLANEOUS) IMPLANT
COVER MAYO STAND STRL (DRAPES) ×2 IMPLANT
COVER PROBE W GEL 5X96 (DRAPES) ×2 IMPLANT
COVER TABLE BACK 60X90 (DRAPES) ×2 IMPLANT
DECANTER SPIKE VIAL GLASS SM (MISCELLANEOUS) IMPLANT
DERMABOND ADVANCED (GAUZE/BANDAGES/DRESSINGS) ×1
DERMABOND ADVANCED .7 DNX12 (GAUZE/BANDAGES/DRESSINGS) ×2 IMPLANT
DEVICE DUBIN W/COMP PLATE 8390 (MISCELLANEOUS) IMPLANT
DRAIN CHANNEL 19F RND (DRAIN) IMPLANT
DRAPE LAPAROSCOPIC ABDOMINAL (DRAPES) ×2 IMPLANT
DRAPE UTILITY XL STRL (DRAPES) ×2 IMPLANT
ELECT REM PT RETURN 9FT ADLT (ELECTROSURGICAL) ×2
ELECTRODE REM PT RTRN 9FT ADLT (ELECTROSURGICAL) ×1 IMPLANT
EVACUATOR SILICONE 100CC (DRAIN) IMPLANT
GLOVE ECLIPSE 6.5 STRL STRAW (GLOVE) ×1 IMPLANT
GLOVE EUDERMIC 7 POWDERFREE (GLOVE) ×2 IMPLANT
GOWN PREVENTION PLUS XLARGE (GOWN DISPOSABLE) ×4 IMPLANT
KIT MARKER MARGIN INK (KITS) ×2 IMPLANT
NDL HYPO 25X1 1.5 SAFETY (NEEDLE) ×2 IMPLANT
NDL SAFETY ECLIPSE 18X1.5 (NEEDLE) ×1 IMPLANT
NEEDLE HYPO 18GX1.5 SHARP (NEEDLE) ×2
NEEDLE HYPO 25X1 1.5 SAFETY (NEEDLE) ×4 IMPLANT
NS IRRIG 1000ML POUR BTL (IV SOLUTION) ×2 IMPLANT
PACK BASIN DAY SURGERY FS (CUSTOM PROCEDURE TRAY) ×2 IMPLANT
PENCIL BUTTON HOLSTER BLD 10FT (ELECTRODE) ×2 IMPLANT
PIN SAFETY STERILE (MISCELLANEOUS) IMPLANT
SLEEVE SCD COMPRESS KNEE MED (MISCELLANEOUS) ×2 IMPLANT
SPONGE GAUZE 4X4 12PLY (GAUZE/BANDAGES/DRESSINGS) IMPLANT
SPONGE INTESTINAL PEANUT (DISPOSABLE) IMPLANT
SPONGE LAP 18X18 X RAY DECT (DISPOSABLE) IMPLANT
SPONGE LAP 4X18 X RAY DECT (DISPOSABLE) ×3 IMPLANT
STAPLER VISISTAT 35W (STAPLE) ×1 IMPLANT
SUT ETHILON 2 0 FS 18 (SUTURE) IMPLANT
SUT ETHILON 3 0 FSL (SUTURE) IMPLANT
SUT MNCRL AB 4-0 PS2 18 (SUTURE) ×3 IMPLANT
SUT VIC AB 4-0 BRD 54 (SUTURE) IMPLANT
SUT VICRYL 3-0 CR8 SH (SUTURE) ×4 IMPLANT
SYR CONTROL 10ML LL (SYRINGE) ×4 IMPLANT
TOWEL OR 17X24 6PK STRL BLUE (TOWEL DISPOSABLE) ×2 IMPLANT
TOWEL OR NON WOVEN STRL DISP B (DISPOSABLE) ×2 IMPLANT
TUBE CONNECTING 20X1/4 (TUBING) ×2 IMPLANT
WATER STERILE IRR 1000ML POUR (IV SOLUTION) ×1 IMPLANT
YANKAUER SUCT BULB TIP NO VENT (SUCTIONS) ×2 IMPLANT

## 2011-08-15 NOTE — Interval H&P Note (Signed)
History and Physical Interval Note:   08/15/2011   8:45 AM   Stephanie Malone  has presented today for surgery, with the diagnosis of Right breast cancer  The various methods of treatment have been discussed with the patient and family. After consideration of risks, benefits and other options for treatment, the patient has consented to  Procedure(s): BREAST LUMPECTOMY WITH EXCISION OF SENTINEL NODE as a surgical intervention .  The patients' history has been reviewed, patient examined, no change in status, stable for surgery.  I have reviewed the patients' chart and labs.  Questions were answered to the patient's satisfaction.     Currie Paris  MD

## 2011-08-15 NOTE — H&P (View-Only) (Signed)
NAME: Stephanie Malone                                                                                      DOB: 19-May-1956 DATE: 07/18/2011               MRN: [id number]   CC:  Chief Complaint  Patient presents with  . Breast Cancer    HPI:  Stephanie Malone is a 55 y.o.  female who presents with A newly diagnosed right breast cancer about 4 months ago she noticed some slight lateral nipple inversion. She did not feel a mass. She recently had a mammogram and a 1.5 cm mass was found. A biopsy was done. It showed invasive ductal carcinoma receptor positive, Ki-67 of 3%and a HER-2/neu that was negative. MRI shows only the single lesion. There is no evidence of metastatic disease.  PMH:   has a past medical history of Breast cancer, IDC, Right, ER+,PR+, HER2- (07/10/2011); Migraine; and Degenerative lumbar disc.  PSH:   has no past surgical history on file.  ALLERGIES:  No Known Allergies  MEDICATIONS:   Current Outpatient Prescriptions  Medication Sig Dispense Refill  . ALPRAZolam (XANAX) 0.5 MG tablet Take 0.5 mg by mouth at bedtime as needed.        . SUMAtriptan (IMITREX) 25 MG tablet Take 25 mg by mouth every 2 (two) hours as needed.        . traZODone (DESYREL) 100 MG tablet Take 100 mg by mouth at bedtime.          ROS: Her 12 point review of systems form was filled out and was completely negative.  EXAM:   GENERAL:  The patient is alert, oriented, and generally healthy-appearing, NAD. Mood and affect are normal.  HEENT:  The head is normocephalic, the eyes nonicteric, the pupils were round regular and equal. EOMs are normal. Pharynx normal. Dentition good.  NECK:  The neck is supple and there are no masses or thyromegaly.  LUNGS: Normal respirations and clear to auscultation.  HEART: Regular rhythm, with no murmurs rubs or gallops. Pulses are intact carotid dorsalis pedis and posterior tibial. No significant varicosities are noted.  BREASTS:  there is mild inversion of  the lateral aspect of the right nipple. There is no palpable mass. The skin of the nipple looks normal. The rest are otherwise asymptomatic and normal to palpation.  LYMPHATICS: There is no axillary or supraclavicular adenopathy noted.  ABDOMEN: Soft, flat, and nontender. No masses or organomegaly is noted. No hernias are noted. Bowel sounds are normal.  EXTREMITIES:  Good range of motion, no edema.   DATA REVIEWED:   her mammogram and MRI films and reports were viewed as well as the pathology slides and reports.5  IMPRESSION:  Clinical stage I right breast cancer subareolar with nipple inversion  PLAN:   I think she would benefit from a right partial mastectomy but unfortunately this would require. excision of the nipple areolar complex. I did discuss with her the potential of trying to do a lumpectomy and spare the nipple but that I was concerned that was a nipple inversion there is going to  be involvement.we also discussed the need for a sentinel node evaluation. I gave her a copy of her pathology report I have discussed the indications for the lumpectomy and described the procedure. She understand that the chance of removal of the abnormal area is very good, but that occasionally we are unable to locate it and may have to do a second procedure. We also discussed the possibility of a second procedure to get additional tissue. Risks of surgery such as bleeding and infection have also been explained, as well as the implications of not doing the surgery. She understands and wishes to proceed.

## 2011-08-15 NOTE — Op Note (Signed)
Stephanie Malone  1956-02-18  [id number]  08/15/2011   Preoperative diagnosis: right breast cancer, central, clinical stage I  Postoperative diagnosis: same  Procedure: right partial the central mastectomy with blue dye injection and   Surgeon: Currie Paris, MD, FACS  Anesthesia: General  Clinical History and Indications: this patient was recently diagnosed with a right breast cancer, invasive ductal, clinical stage I. After discussion with the patient and with consultation with medical and radiation oncologists, she elected to have a right partial mastectomy including removal of the nipple areolar complex. She also needed a sentinel node evaluation.  Description of Procedure:The patient was seen in the holding area. We reviewed the plans for the procedure as noted above. I marked the right breast as the operative site.  Patient was taken to the operating room. After satisfactory general (LMA) anesthesia had been obtained it is an ultrasound to confirm the location of the mass at the right edge of the right areolar margin. There is obvious nipple inversion.  A timeout was done. I then injected 5 cc of dilute methylene blue subareolar only. This was massaged in. The full prep and drape was then done.  I made an elliptical incision to include excision of the nipple areolar complex. I divided the breast tissue down almost to the chest wall medially. I then came under the specimen. Finally, it was detached laterally. By palpation the tumor was in the middle of the specimen. I thought we had adequate margins all around. I discussed a mammogram showing the clip in the middle of the specimen.  I irrigated and made sure everything was dry. Operative clips in to mark the margins. I injected about 20 cc of 0.25% plain Marcaine to help with postop analgesia. The breast was closed in layers with 3-0 Vicryl, 4-0 Monocryl subcuticular and Dermabond.  Attention was turned to the axilla. Using the  neoprobe identified a hot area and a transverse incision and deepened it until I saw axillary fat. I immediately identified 3 separate lymphatics and traced as into a single hot blue lymph node which was excised. As I put traction on the lymph node and I saw second blue lymph node and adjacent fat and removed as well. The first node had counts of 1700 the second of about 500.  Using the neoprobe was still hot area present. Further dissection revealed a third blue lymph node which had counts of about 2100. This was likewise removed. Bleeders were either cauterized or clipped. Once his final node was removed there counts of 0-10 in the entire axilla.there is no palpably enlarged nodes nor any other blue lymphatics leading anywhere that was suggestive of another sentinel node.  This incision was then injected with Marcaine and closed in layers with 3-0 Vicryl, 4-0 Monocryl subcuticular, and Dermabond.  The patient tolerated the procedure well. There were no operative complications. All counts were correct.   WUJ:[id number]  Currie Paris, MD, FACS 08/15/2011 10:19 AM

## 2011-08-15 NOTE — Transfer of Care (Signed)
Immediate Anesthesia Transfer of Care Note  Patient: Stephanie Malone  Procedure(s) Performed:  BREAST LUMPECTOMY WITH EXCISION OF SENTINEL NODE  Patient Location: PACU  Anesthesia Type: General  Level of Consciousness: awake and oriented  Airway & Oxygen Therapy: Patient Spontanous Breathing and Patient connected to face mask oxygen  Post-op Assessment: Report given to PACU RN and Post -op Vital signs reviewed and stable  Post vital signs: stable  Complications: No apparent anesthesia complications

## 2011-08-15 NOTE — Anesthesia Postprocedure Evaluation (Signed)
  Anesthesia Post-op Note  Patient: Stephanie Malone  Procedure(s) Performed:  BREAST LUMPECTOMY WITH EXCISION OF SENTINEL NODE  Patient Location: PACU  Anesthesia Type: General  Level of Consciousness: awake  Airway and Oxygen Therapy: Patient Spontanous Breathing and Patient connected to face mask oxygen  Post-op Pain: none  Post-op Assessment: Post-op Vital signs reviewed, Patient's Cardiovascular Status Stable, Respiratory Function Stable, Patent Airway and No signs of Nausea or vomiting  Post-op Vital Signs: stable  Complications: No apparent anesthesia complications

## 2011-08-15 NOTE — Anesthesia Procedure Notes (Signed)
Procedure Name: LMA Insertion Date/Time: 08/15/2011 8:59 AM Performed by: Zenia Resides D Pre-anesthesia Checklist: Patient identified, Emergency Drugs available, Suction available and Patient being monitored Patient Re-evaluated:Patient Re-evaluated prior to inductionOxygen Delivery Method: Circle System Utilized Preoxygenation: Pre-oxygenation with 100% oxygen Intubation Type: IV induction Ventilation: Mask ventilation without difficulty LMA: LMA with gastric port inserted LMA Size: 4.0 Number of attempts: 1 Placement Confirmation: positive ETCO2 and breath sounds checked- equal and bilateral Tube secured with: Tape Dental Injury: Teeth and Oropharynx as per pre-operative assessment

## 2011-08-15 NOTE — Anesthesia Preprocedure Evaluation (Signed)
Anesthesia Evaluation  Patient identified by MRN, date of birth, ID band Patient awake    Reviewed: Allergy & Precautions, H&P , NPO status , Patient's Chart, lab work & pertinent test results  Airway Mallampati: II TM Distance: >3 FB Neck ROM: full    Dental No notable dental hx. (+) Teeth Intact   Pulmonary neg pulmonary ROS,  clear to auscultation  Pulmonary exam normal       Cardiovascular neg cardio ROS regular Normal    Neuro/Psych Negative Neurological ROS  Negative Psych ROS   GI/Hepatic negative GI ROS, Neg liver ROS,   Endo/Other  Negative Endocrine ROS  Renal/GU negative Renal ROS  Genitourinary negative   Musculoskeletal   Abdominal   Peds  Hematology negative hematology ROS (+)   Anesthesia Other Findings   Reproductive/Obstetrics negative OB ROS                           Anesthesia Physical Anesthesia Plan  ASA: II  Anesthesia Plan: General   Post-op Pain Management:    Induction: Intravenous  Airway Management Planned: LMA  Additional Equipment:   Intra-op Plan:   Post-operative Plan: Extubation in OR  Informed Consent: I have reviewed the patients History and Physical, chart, labs and discussed the procedure including the risks, benefits and alternatives for the proposed anesthesia with the patient or authorized representative who has indicated his/her understanding and acceptance.     Plan Discussed with: CRNA and Surgeon  Anesthesia Plan Comments:         Anesthesia Quick Evaluation

## 2011-08-17 ENCOUNTER — Encounter (HOSPITAL_BASED_OUTPATIENT_CLINIC_OR_DEPARTMENT_OTHER): Payer: Self-pay | Admitting: Surgery

## 2011-08-20 ENCOUNTER — Telehealth (INDEPENDENT_AMBULATORY_CARE_PROVIDER_SITE_OTHER): Payer: Self-pay | Admitting: General Surgery

## 2011-08-20 NOTE — Telephone Encounter (Signed)
Patient made aware of path results. Appt made for follow up on 08/28/11.

## 2011-08-20 NOTE — Telephone Encounter (Signed)
Message copied by Liliana Cline on Mon Aug 20, 2011 10:22 AM ------      Message from: Currie Paris      Created: Fri Aug 17, 2011  1:37 PM       Tell the patient that her margins are OK and her lymph nodes are negative. I will discuss in detail in the office.

## 2011-08-21 ENCOUNTER — Encounter: Payer: Self-pay | Admitting: *Deleted

## 2011-08-21 NOTE — Progress Notes (Signed)
Ordered Oncotype Dx w/ [facility].  Faxed request to Pathology.  Faxed PAC to [employer]. 

## 2011-08-22 ENCOUNTER — Telehealth (INDEPENDENT_AMBULATORY_CARE_PROVIDER_SITE_OTHER): Payer: Self-pay | Admitting: General Surgery

## 2011-08-22 NOTE — Telephone Encounter (Signed)
Patient aware path results are good. Lymph nodes negative and margins ok. She will follow up in the office at her scheduled appt and call with any questions prior.  

## 2011-08-22 NOTE — Telephone Encounter (Signed)
Message copied by Liliana Cline on Wed Aug 22, 2011  3:58 PM ------      Message from: Currie Paris      Created: Wed Aug 22, 2011  3:16 PM       Tell the patient that her margins are OK and her lymph nodes are negative. I will discuss in detail in the office.

## 2011-08-23 ENCOUNTER — Encounter: Payer: Self-pay | Admitting: *Deleted

## 2011-08-23 ENCOUNTER — Telehealth: Payer: Self-pay | Admitting: Oncology

## 2011-08-23 NOTE — Telephone Encounter (Signed)
caled pts home lmovm that her appts were r/s to 12/20 for oncotype results. asked to rtn call to confirm appts

## 2011-08-24 ENCOUNTER — Telehealth: Payer: Self-pay | Admitting: Oncology

## 2011-08-24 NOTE — Telephone Encounter (Signed)
pt rtn call and confirm appts for 09/06/2011

## 2011-08-28 ENCOUNTER — Ambulatory Visit (INDEPENDENT_AMBULATORY_CARE_PROVIDER_SITE_OTHER): Payer: BC Managed Care – PPO | Admitting: Surgery

## 2011-08-28 ENCOUNTER — Encounter (INDEPENDENT_AMBULATORY_CARE_PROVIDER_SITE_OTHER): Payer: Self-pay | Admitting: Surgery

## 2011-08-28 VITALS — BP 126/80 | HR 66 | Temp 97.8°F | Resp 16 | Ht 65.5 in | Wt 175.6 lb

## 2011-08-28 DIAGNOSIS — Z9889 Other specified postprocedural states: Secondary | ICD-10-CM

## 2011-08-28 NOTE — Patient Instructions (Signed)
See me about a month after you finish radiation, sooner if there are any problems or concerns

## 2011-08-28 NOTE — Progress Notes (Signed)
Stephanie Malone    [id number] 08/28/2011    [birth year]   CC: Post op lumpectomy  HPI: The patient returns for post op follow-up. She underwent a Right central partial mastectomy and SLN  on 11/28. Over all she feels that she is doing well.   PE: The incision is healing nicely and there is no evidence of infection or hematoma. Marland Kitchen  DATA REVIEWED: Pathology report showed Stage I IDC, negative margins and negative SLN  IMPRESSION: Patient doing well.    PLAN: Her next visit will be in two months when she has finished radiation, Discussed her path and gave her a copy.

## 2011-08-29 ENCOUNTER — Other Ambulatory Visit: Payer: BC Managed Care – PPO | Admitting: Lab

## 2011-08-29 ENCOUNTER — Ambulatory Visit: Payer: BC Managed Care – PPO | Admitting: Oncology

## 2011-08-30 ENCOUNTER — Encounter: Payer: Self-pay | Admitting: *Deleted

## 2011-08-30 NOTE — Progress Notes (Signed)
Received Oncotype Dx results of 7.  Gave copy to MD.

## 2011-09-06 ENCOUNTER — Other Ambulatory Visit: Payer: BC Managed Care – PPO | Admitting: Lab

## 2011-09-06 ENCOUNTER — Ambulatory Visit (HOSPITAL_BASED_OUTPATIENT_CLINIC_OR_DEPARTMENT_OTHER): Payer: BC Managed Care – PPO | Admitting: Oncology

## 2011-09-06 ENCOUNTER — Other Ambulatory Visit: Payer: Self-pay

## 2011-09-06 VITALS — BP 117/86 | HR 71 | Temp 98.4°F | Ht 65.5 in | Wt 173.3 lb

## 2011-09-06 DIAGNOSIS — C50119 Malignant neoplasm of central portion of unspecified female breast: Secondary | ICD-10-CM

## 2011-09-06 DIAGNOSIS — C50919 Malignant neoplasm of unspecified site of unspecified female breast: Secondary | ICD-10-CM

## 2011-09-06 MED ORDER — TAMOXIFEN CITRATE 20 MG PO TABS: 20.0000 mg | ORAL_TABLET | Freq: Every day | ORAL | Status: AC

## 2011-09-06 NOTE — Progress Notes (Addendum)
CC:   Stephanie Malone, M.D. Stephanie Malone, M.D. Stephanie Malone, Ph.D., M.D. Stephanie Bang, MD Stephanie Malone, M.D.  HPI: Stephanie Malone is a 55 year old [place] woman referred by Dr. Jean Rosenthal for evaluation and treatment in the setting of newly diagnosed breast cancer.  The patient had an unremarkable screening mammogram in June of 2011 at the [facility].  However, some time ago she noted what looked to her like nipple inversion.  She brought this to Dr. Vicente Males attention and he set her up for diagnostic mammography at the [facility], July 09, 2011.  This showed a heterogeneously dense breast pattern and new mild inversion of the right nipple.  There was also subtle distortion in the lateral subareolar portion of the right breast. Mammography also showed a small nodule medially within the right breast which was an interval change.  The left breast was unremarkable.  On physical exam, Dr. Jean Rosenthal noted the nipple inversion but no palpable mass and no palpable right axillary adenopathy.  Ultrasonography showed an irregularly marginated hypoechoic mass within the subareolar portion of the right breast at the 9 o'clock position measuring 1.3 cm.  There was also an intramammary lymph node with asymmetrical cortical thickening at the 4 o'clock position in the right breast.  This measured 6 mm.  The ultrasound of the axilla was unremarkable.  With this information, ultrasound-guided core biopsy of the right breast mass was performed.  On the initial pass, the mass collapsed consistent with a complicated cyst.  The pathology from this procedure ([id number]) showed an invasive mammary carcinoma, grade 1 which was strongly estrogen and progesterone receptor positive at 99% and 100% respectively.  The MIB-1 was 3%.  There was no amplification of HER-2 by CISH, the ratio being 1.43.  With this information, the patient was referred to Dr. Jamey Ripa and bilateral breast MRIs were  obtained July 13, 2011.  This again showed the right lateral sub areolar mass measuring 1.5 cm.  The other area of the right breast, which had also been previously biopsied, had been shown to be benign. The patient was evaluated at the multidisciplinary breast cancer clinic and proceeded to definitive Right lumpectomy and sentinel lymph node sampling August 15, 2011.  Interval history: Stephanie Malone did well with her surgery. There was no unusual pain, fever, swelling, bleeding, or dehiscence. She is back to normal activities, and she is on her treadmill 5 times a week, for about 30 minutes at a time. She has a history of migraines and these are no more frequent or intense than usual. She also has a history of low back pain which again is intermittent, and has not increased in intensity or frequency. A detailed review of systems was otherwise unremarkable. As far as her menopausal status is concerned I recall that she is status post hysterectomy, but she was having intermittent periods up to the time of her hysterectomy earlier this year.  PAST MEDICAL HISTORY:  Significant for migraines and degenerative disk disease.  She is status post pelvic floor repair and Burch procedure in 1997 and hysterectomy with further pelvic floor repair in May of 2012. This was a simple hysterectomy with no salpingo-oophorectomy and benign pathology ([id number]).  FAMILY HISTORY:  The patient's father is alive at age 23.  The patient's mother died at age 80.  She had 1 brother, no sisters.  There is no history of breast or ovarian cancer in the family.  The father's mother may have had  multiple myeloma.  The father himself has a history of hepatocellular cancer diagnosed a year ago, and 1 of the patient's father's brothers had pancreatic cancer.  GYNECOLOGIC HISTORY:  She had menarche age 37.  Last menstrual period was February of this year.  She has been using hormone replacement for the past 2 years but just  stopped this week.  She is GX, P3, 1st pregnancy to term at age 62, which she understands is a risk factor for breast cancer.  The patient used oral contraceptives for approximately 5 years.  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  Her husband, [name], is present today.  Their sons are [name], a 86 year old, lives in [place] and is a Consulting civil engineer; [name], 61, attends [name] as a Consulting civil engineer; and [name] is at home where she, of course, goes to high school.  HEALTH MAINTENANCE:  The patient smoked minimally, perhaps as much as 5- pack-years.  She quit in 1995.  She does not use alcohol.  She had a colonoscopy in October of 2011, a bone density in 2010 which was normal (there is a copy in the chart), most recent Pap smear September of 2011.  ALLERGIES:  No known drug allergies.  MEDICATIONS:  She takes Imitrex as needed, Phenergan as needed, trazodone at bedtime, and Xanax as needed.  REVIEW OF SYSTEMS:  She has some low back pain which is fairly chronic has not increased in intensity or frequency recently and does not limit her activities.  She has her history of migraine headaches, but otherwise a separately scanned detailed review of systems was noncontributory.  PHYSICAL EXAM:  [name] is a middle-aged white woman who is 5 feet and 1/2 inches tall and weighs 176 pounds.  Temperature is 98.4, pulse 76, respiratory rate 20, and blood pressure 126/81.  Sclerae are not icteric.  Oropharynx is clear.  I do not palpate any peripheral adenopathy including in the right axilla.  Lungs:  No crackles or wheezes.  Heart:  Regular rate and rhythm.  No murmur appreciated. Breasts:  Right breast, there is some thickening in the sub areolar area but I could not give a measurement for this.  There is no really discrete mass.  The nipple retraction is noticeable as compared with the left.  The left side is entirely unremarkable.  Abdomen:  Benign. Musculoskeletal:  No focal  spinal tenderness.  No peripheral edema. Neurologic:  Nonfocal.  LAB WORK:  Shows a completely normal CBC, C-MET and CA 27-29 which is non-informative.  Specifically, the white cell count is 5.8, hemoglobin 14.1, and platelets 305,000, creatinine 0.79, normal liver function tests and normal electrolytes.  FILMS:  In addition to the films already noted, the patient had an abdominal ultrasound in July of this year for evaluation of occasional abdominal pain.  There were no gallstones.  The liver had a normal echogenic pattern.  Overall this was a negative exam.  The patient had a head CT in December of 2002 which showed only chronic and acute sinusitis.  IMPRESSION:  A 55 year old [place] woman status post right central lumpectomy November of 2000 for a T1c N0 (Stage I) invasive ductal carcinoma, grade 1, strongly estrogen and progesterone receptor positive with no HER-2 amplification, and an MIB-1 of 3%.  PLAN: We reviewed the results of her pathology and went through the adjuvant! Prognostic panel. This would put her at risk of dying within the next 10 years from this tumor in the 3% range  if all she did was local treatment. The risk of recurrence would be 18% under the same parameters. She can cut that risk by 11% if she takes anti-estrogen therapy, which would bring her to a final risk of recurrence of about 7%. Chemotherapy generally reduces risk by about a third; this means she might get as much as a 2% or 3% risk reduction from chemotherapy. However when tumors are in the luminal A subgroup as hers most likely is, the benefit of chemotherapy actually may be closer to 0.  Accordingly we are going to forego chemotherapy and go straight to radiation. She will be meeting with Dr. Roselind Messier to begin planning her treatments. I expect she will be done by the end of February and start tamoxifen early March. We did discuss the possible toxicities side effects and complications of that medication. I  would like her to be on tamoxifen perhaps a couple of months before she sees me so that we can accurately assess side effects. Accordingly she will see me again in mid May. She knows to call for any problems that may develop before the next visit.  ADDENDUM: the patient's ONCOTYPE DX reports a recurrence score of 7, predicting a risk of distant recurrence of 6% if she takes tamoxifen for 5 years; this confirms the predicted lack of benefit from chemotherapy discussed above. ______________________________ Lowella Dell, M.D. GCM/MEDQ  D:  07/20/2011  T:  07/23/2011  Job:  [id number]

## 2011-09-07 ENCOUNTER — Telehealth: Payer: Self-pay | Admitting: *Deleted

## 2011-09-07 NOTE — Telephone Encounter (Signed)
gave patient appointment with dr.kinard in 09-2011 printed out calendar and gave to the patient

## 2011-09-17 ENCOUNTER — Encounter: Payer: Self-pay | Admitting: Radiation Oncology

## 2011-09-18 HISTORY — PX: OTHER SURGICAL HISTORY: SHX169

## 2011-09-19 ENCOUNTER — Ambulatory Visit
Admission: RE | Admit: 2011-09-19 | Discharge: 2011-09-19 | Disposition: A | Discharge status: Home / Self Care | Payer: BC Managed Care – PPO | Source: Ambulatory Visit | Attending: Radiation Oncology | Admitting: Radiation Oncology

## 2011-09-19 VITALS — BP 123/80 | HR 73 | Temp 97.9°F | Ht 65.0 in | Wt 179.6 lb

## 2011-09-19 DIAGNOSIS — Z170 Estrogen receptor positive status [ER+]: Secondary | ICD-10-CM | POA: Insufficient documentation

## 2011-09-19 DIAGNOSIS — Z79899 Other long term (current) drug therapy: Secondary | ICD-10-CM | POA: Insufficient documentation

## 2011-09-19 DIAGNOSIS — L299 Pruritus, unspecified: Secondary | ICD-10-CM | POA: Insufficient documentation

## 2011-09-19 DIAGNOSIS — C50119 Malignant neoplasm of central portion of unspecified female breast: Secondary | ICD-10-CM

## 2011-09-19 DIAGNOSIS — R5381 Other malaise: Secondary | ICD-10-CM | POA: Insufficient documentation

## 2011-09-19 DIAGNOSIS — C50919 Malignant neoplasm of unspecified site of unspecified female breast: Secondary | ICD-10-CM | POA: Insufficient documentation

## 2011-09-19 DIAGNOSIS — R5383 Other fatigue: Secondary | ICD-10-CM | POA: Insufficient documentation

## 2011-09-19 NOTE — Progress Notes (Signed)
Please see the Nurse Progress Note in the MD Initial Consult Encounter for this patient. 

## 2011-09-19 NOTE — Progress Notes (Signed)
CC:   Stephanie Malone, M.D. Stephanie Malone, M.D. Stephanie Malone, M.D.  REFERRING PHYSICIAN:  Currie Malone, M.D.  DIAGNOSIS:  Right breast cancer (T1c, N0, MX).  NARRATIVE:  Stephanie Malone returns today for further evaluation.  She was initially seen in the Multidisciplinary Breast Clinic on July 18, 2011.  The patient did proceed with definitive surgery on November 28th under the direction of Dr. Jamey Malone.  The patient underwent partial mastectomy and sentinel node procedure.  Given the subareolar location, the patient did require surgical removal of the nipple areolar complex with her surgery.  On pathologic review, the patient was found have an invasive well-differentiated ductal carcinoma measuring 1.5 cm in greatest dimension.  The surgical margins were clear with the closest margin being 1 cm (superior and inferior).  Tumor was estrogen receptor positive at 99% and progesterone receptor positive at 100%.  There was lymphovascular space invasion noted in the breast specimen.  The patient had 3 benign sentinel lymph nodes as well as additional tissue from the right axillary area showing no evidence of malignancy.  Postoperatively the patient has done well.  She did meet with Dr. Darnelle Malone and given the patient's excellent prognosis, adjuvant chemotherapy was not recommended.  The patient will proceed with hormonal therapy after completion of her radiation therapy.  REVIEW OF SYSTEMS:  The patient has occasional sharp shooting pains within the right breast area, but no consistent pain.  She denies any numbness in the breast area or arm.  She denies any problems with swelling in her right arm or hand.  PHYSICAL EXAMINATION:  The patient's temperature is 97.9, pulse 73, blood pressure 123/80, weight is 179 pounds, height is 5 feet 5 inches. Examination of the neck and supraclavicular region reveals no evidence of adenopathy.  The axillary areas are free of adenopathy.   Examination of the lungs reveals them to be clear.  The heart has a regular rhythm and rate.  Examination of the left breast reveals no mass or nipple discharge.  Examination of the right breast area reveals horizontal scar with the nipple-areolar complex surgically absent.  There are no signs of drainage or infection in the breast.  There is no dominant mass appreciated in the breast.  IMPRESSION/PLAN:  Stage I invasive ductal carcinoma of the right breast. The patient would be an excellent candidate for breast conserving therapy with radiation treatments directed at the right breast area.  I have discussed the overall treatment course, side effects and potential toxicities of radiation therapy in this situation with Stephanie Malone.  The patient appears to understand and wishes to proceed with treatments. The patient will return on January 7th for CT simulation with treatments to begin approximately January 14th.  This should allow adequate time for further healing from her surgery.  I anticipate approximately 5- 1/2 weeks of radiation therapy directed at the right breast with an additional week of radiation therapy directed at the anterior central portion of the right breast.   ______________________________ Stephanie Malone, Ph.D., M.D. JDK/MEDQ  D:  09/19/2011  T:  09/19/2011  Job:  [id number]

## 2011-09-19 NOTE — Progress Notes (Signed)
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 Wears contacts/reading glasses.

## 2011-09-24 ENCOUNTER — Ambulatory Visit
Admission: RE | Admit: 2011-09-24 | Discharge: 2011-09-24 | Disposition: A | Discharge status: Home / Self Care | Payer: BC Managed Care – PPO | Source: Ambulatory Visit | Attending: Radiation Oncology | Admitting: Radiation Oncology

## 2011-09-24 DIAGNOSIS — C50119 Malignant neoplasm of central portion of unspecified female breast: Secondary | ICD-10-CM

## 2011-09-24 NOTE — Progress Notes (Signed)
DIAGNOSIS:  Right breast cancer.  NARRATIVE:  Earlier today Ms. Sardina underwent treatment planning to begin radiation therapy directed at the right breast area.  The patient was placed on the CT simulator table in the breast multi Quest positioning device.  The patient also had Aquaphor mold placed underneath the head and neck area for accurate setup and treatment.  The patient then had a wire placed along the lumpectomy scar in the central portion of the breast.  She then proceeded to undergo CT scan in the treatment position.  Under virtual simulation, the lumpectomy scar as well as associated clips within the central portion of the breast were outlined.  Patient also had outlining for the heart, lung, trachea.  The patient then proceeded to have setup of 2 tangential beams encompassing the right breast.  Forward planning will be used to improve the dose homogeneity.  In addition, wedging will be used to improve the dose homogeneity.  TREATMENT PLAN:  The patient is to proceed with daily radiation treatments at 180 cGy per day for 28 treatments for an initial dose of the right breast to 5040 cGy.  Patient will then proceed with additional planning and continue with a boost directed at the central portion of the right breast and continue to a cumulative dose of 6040 cGy.  The patient will likely be treated with a combination of 6 and 18 mV photons.    ______________________________ Billie Lade, Ph.D., M.D. JDK/MEDQ  D:  09/24/2011  T:  09/24/2011  Job:  [id number]

## 2011-09-24 NOTE — Patient Care Conference (Signed)
Met with patient to discuss RO billing.   Patient had no concerns today.  Patient did mention previous bills and asked about bills being combined.  Told patient to contact patient accounting once she rec'd her rad onc bill to see if they will combine the accounts into one payment.

## 2011-10-01 ENCOUNTER — Other Ambulatory Visit: Payer: Self-pay | Admitting: Oncology

## 2011-10-01 ENCOUNTER — Ambulatory Visit
Admission: RE | Admit: 2011-10-01 | Discharge: 2011-10-01 | Disposition: A | Discharge status: Home / Self Care | Payer: BC Managed Care – PPO | Source: Ambulatory Visit | Attending: Radiation Oncology | Admitting: Radiation Oncology

## 2011-10-01 DIAGNOSIS — C50119 Malignant neoplasm of central portion of unspecified female breast: Secondary | ICD-10-CM

## 2011-10-01 NOTE — Progress Notes (Signed)
[facility] Radiation Oncology Simulation Verification Note   Name: HALYN FLAUGHER MRN: [id number]   Date: @T @  DOB: 09-24-1955  Status:outpatient   DIAGNOSIS:  1. Breast cancer, IDC, Right, ER+,PR+, HER2-     POSITION: Patient was placed in the supine position on the treatment machine.  Isocenter and MLCS were reviewed and treatment was approved.  NARRATIVE: Patient tolerated simulation well.

## 2011-10-02 ENCOUNTER — Ambulatory Visit
Admission: RE | Admit: 2011-10-02 | Discharge: 2011-10-02 | Disposition: A | Discharge status: Home / Self Care | Payer: BC Managed Care – PPO | Source: Ambulatory Visit | Attending: Radiation Oncology | Admitting: Radiation Oncology

## 2011-10-02 ENCOUNTER — Encounter: Payer: Self-pay | Admitting: Radiation Oncology

## 2011-10-02 VITALS — Wt 177.4 lb

## 2011-10-02 DIAGNOSIS — C50119 Malignant neoplasm of central portion of unspecified female breast: Secondary | ICD-10-CM

## 2011-10-02 MED ORDER — RADIAPLEXRX EX GEL
Freq: Once | CUTANEOUS | Status: AC
  Administered 2011-10-02: 170 via TOPICAL

## 2011-10-02 MED ORDER — ALRA NON-METALLIC DEODORANT (RAD-ONC)
1.0000 "application " | Freq: Once | TOPICAL | Status: AC
  Administered 2011-10-02: 1 via TOPICAL

## 2011-10-02 NOTE — Progress Notes (Signed)
Post sim teaching radiation therapy and you book,flyer on skin products and how to use, alra deodorant/radiaplex gel given , pt denies any pain, or discomfort, no skin changes, 1st tx 3:37 PM

## 2011-10-02 NOTE — Progress Notes (Signed)
[facility]    Radiation Oncology [address] [place]     Maryln Gottron, M.D. [place], [place] [address]               Billie Lade, M.D., Ph.D. Phone: [phone]      Molli Hazard A. Kathrynn Running, M.D. Fax: [phone]      Radene Gunning, M.D., Ph.D.         Lurline Hare, M.D.         Grayland Jack, M.D Weekly Treatment Management Note  Name: Stephanie Malone     MRN: [id number]        CSN: [id number] Date: 10/02/2011      DOB: Jul 11, 1956  CC: Stephanie Sauer, MD, MD         Stephanie Malone    Status: Outpatient  Diagnosis: The encounter diagnosis was Breast cancer, IDC, Right, ER+,PR+, HER2-.  Current Dose: 180  Current Fraction: 1  Planned Dose: 6040 cGy  Narrative: Stephanie Malone was seen today for weekly treatment management. The chart was checked and port films  were reviewed. Tolerating tx well at this time.  Review of patient's allergies indicates no known allergies.   Current Outpatient Prescriptions  Medication Sig Dispense Refill  . ALPRAZolam (XANAX) 0.5 MG tablet Take 0.5 mg by mouth at bedtime as needed.        . cyclobenzaprine (FLEXERIL) 10 MG tablet Take 10 mg by mouth 3 (three) times daily as needed.        . hyaluronate sodium (RADIAPLEXRX) GEL Apply topically 2 (two) times daily.      Marland Kitchen HYDROmorphone (DILAUDID) 2 MG tablet as needed.      . naproxen sodium (ANAPROX) 220 MG tablet Take 220 mg by mouth 2 (two) times daily with a meal.        . non-metallic deodorant (ALRA) MISC Apply 1 application topically daily as needed.      . promethazine (PHENERGAN) 25 MG tablet Take 25 mg by mouth every 6 (six) hours as needed.        . SUMAtriptan (IMITREX) 100 MG tablet Take 100 mg by mouth as needed.        . tamoxifen (NOLVADEX) 20 MG tablet Take 1 tablet (20 mg total) by mouth daily.  90 tablet  0  . traZODone (DESYREL) 100 MG tablet Take 100 mg by mouth at bedtime.         No current facility-administered medications for this encounter.    Facility-Administered Medications Ordered in Other Encounters  Medication Dose Route Frequency Provider Last Rate Last Dose  . hyaluronate sodium (RADIAPLEXRX) gel   Topical Once Billie Lade, MD   170 application at 10/02/11 1500  . non-metallic deodorant (ALRA) 1 application  1 application Topical Once Billie Lade, MD   1 application at 10/02/11 1500   Labs:  Lab Results  Component Value Date   WBC 5.8 07/18/2011   HGB 12.1 08/15/2011   HCT 41.5 07/18/2011   MCV 89.2 07/18/2011   PLT 305 07/18/2011   Lab Results  Component Value Date   CREATININE 0.79 07/18/2011   BUN 11 07/18/2011   NA 137 07/18/2011   K 3.9 07/18/2011   CL 100 07/18/2011   CO2 27 07/18/2011   Lab Results  Component Value Date   ALT 17 07/18/2011   AST 21 07/18/2011   BILITOT 0.4 07/18/2011    Physical Examination:  weight is 177 lb 6.4 oz (80.468 kg).  Wt Readings from Last 3 Encounters:  10/02/11 177 lb 6.4 oz (80.468 kg)  09/19/11 179 lb 9.6 oz (81.466 kg)  09/06/11 173 lb 4.8 oz (78.608 kg)     Lungs - Normal respiratory effort, chest expands symmetrically. Lungs are clear to auscultation, no crackles or wheezes.  Heart has regular rhythm and rate  Abdomen is soft and non tender with normal bowel sounds Right breast shows no radiation reaction at this point.  Assessment:  Patient tolerating treatments well  Plan: Continue treatment per original radiation prescription

## 2011-10-03 ENCOUNTER — Ambulatory Visit
Admission: RE | Admit: 2011-10-03 | Discharge: 2011-10-03 | Disposition: A | Discharge status: Home / Self Care | Payer: BC Managed Care – PPO | Source: Ambulatory Visit | Attending: Radiation Oncology | Admitting: Radiation Oncology

## 2011-10-04 ENCOUNTER — Ambulatory Visit
Admission: RE | Admit: 2011-10-04 | Discharge: 2011-10-04 | Disposition: A | Discharge status: Home / Self Care | Payer: BC Managed Care – PPO | Source: Ambulatory Visit | Attending: Radiation Oncology | Admitting: Radiation Oncology

## 2011-10-05 ENCOUNTER — Ambulatory Visit
Admission: RE | Admit: 2011-10-05 | Discharge: 2011-10-05 | Disposition: A | Discharge status: Home / Self Care | Payer: BC Managed Care – PPO | Source: Ambulatory Visit | Attending: Radiation Oncology | Admitting: Radiation Oncology

## 2011-10-08 ENCOUNTER — Ambulatory Visit
Admission: RE | Admit: 2011-10-08 | Discharge: 2011-10-08 | Disposition: A | Discharge status: Home / Self Care | Payer: BC Managed Care – PPO | Source: Ambulatory Visit | Attending: Radiation Oncology | Admitting: Radiation Oncology

## 2011-10-09 ENCOUNTER — Ambulatory Visit
Admission: RE | Admit: 2011-10-09 | Discharge: 2011-10-09 | Disposition: A | Discharge status: Home / Self Care | Payer: BC Managed Care – PPO | Source: Ambulatory Visit | Attending: Radiation Oncology | Admitting: Radiation Oncology

## 2011-10-09 DIAGNOSIS — C50119 Malignant neoplasm of central portion of unspecified female breast: Secondary | ICD-10-CM

## 2011-10-09 NOTE — Progress Notes (Signed)
[facility]    Radiation Oncology [address] [place]     Maryln Gottron, M.D. [place], [place] [address]               Billie Lade, M.D., Ph.D. Phone: [phone]      Molli Hazard A. Kathrynn Running, M.D. Fax: [phone]      Radene Gunning, M.D., Ph.D.         Lurline Hare, M.D.         Grayland Jack, M.D Weekly Treatment Management Note  Name: Stephanie Malone     MRN: [id number]        CSN: [id number] Date: 10/09/2011      DOB: November 16, 1955  CC: [name], MD, MD         [name]    Status: Outpatient  Diagnosis: The encounter diagnosis was Breast cancer, IDC, Right, ER+,PR+, HER2-.  Current Dose: 1080  Current Fraction: 6  Planned Dose: 6040  Narrative: Stephanie Malone was seen today for weekly treatment management. The chart was checked and port films  were reviewed. Pt. Has noticed mild sensitivity to skin of right breast.  Review of patient's allergies indicates no known allergies. Current Outpatient Prescriptions  Medication Sig Dispense Refill  . ALPRAZolam (XANAX) 0.5 MG tablet Take 0.5 mg by mouth at bedtime as needed.        . cyclobenzaprine (FLEXERIL) 10 MG tablet Take 10 mg by mouth 3 (three) times daily as needed.        . hyaluronate sodium (RADIAPLEXRX) GEL Apply topically 2 (two) times daily.      Marland Kitchen HYDROmorphone (DILAUDID) 2 MG tablet as needed.      . naproxen sodium (ANAPROX) 220 MG tablet Take 220 mg by mouth 2 (two) times daily with a meal.        . non-metallic deodorant (ALRA) MISC Apply 1 application topically daily as needed.      . promethazine (PHENERGAN) 25 MG tablet Take 25 mg by mouth every 6 (six) hours as needed.        . SUMAtriptan (IMITREX) 100 MG tablet Take 100 mg by mouth as needed.        . traZODone (DESYREL) 100 MG tablet Take 100 mg by mouth at bedtime.         Labs:  Lab Results  Component Value Date   WBC 5.8 07/18/2011   HGB 12.1 08/15/2011   HCT 41.5 07/18/2011   MCV 89.2 07/18/2011   PLT 305 07/18/2011   Lab  Results  Component Value Date   CREATININE 0.79 07/18/2011   BUN 11 07/18/2011   NA 137 07/18/2011   K 3.9 07/18/2011   CL 100 07/18/2011   CO2 27 07/18/2011   Lab Results  Component Value Date   ALT 17 07/18/2011   AST 21 07/18/2011   BILITOT 0.4 07/18/2011    Physical Examination:  vitals were not taken for this visit.   Wt Readings from Last 3 Encounters:  10/02/11 177 lb 6.4 oz (80.468 kg)  09/19/11 179 lb 9.6 oz (81.466 kg)  09/06/11 173 lb 4.8 oz (78.608 kg)     Lungs - Normal respiratory effort, chest expands symmetrically. Lungs are clear to auscultation, no crackles or wheezes.  Heart has regular rhythm and rate  Abdomen is soft and non tender with normal bowel sounds Right breast shows mild erythema  Assessment:  Patient tolerating treatments well  Plan: Continue treatment per original radiation prescription

## 2011-10-09 NOTE — Progress Notes (Signed)
Here for weekly radiation treatment for right breast ca. Completed 6/33 treatments. No problems today. Reinforced not to shave right axilla.

## 2011-10-10 ENCOUNTER — Ambulatory Visit
Admission: RE | Admit: 2011-10-10 | Discharge: 2011-10-10 | Disposition: A | Discharge status: Home / Self Care | Payer: BC Managed Care – PPO | Source: Ambulatory Visit | Attending: Radiation Oncology | Admitting: Radiation Oncology

## 2011-10-11 ENCOUNTER — Ambulatory Visit
Admission: RE | Admit: 2011-10-11 | Discharge: 2011-10-11 | Disposition: A | Discharge status: Home / Self Care | Payer: BC Managed Care – PPO | Source: Ambulatory Visit | Attending: Radiation Oncology | Admitting: Radiation Oncology

## 2011-10-12 ENCOUNTER — Ambulatory Visit
Admission: RE | Admit: 2011-10-12 | Discharge: 2011-10-12 | Disposition: A | Discharge status: Home / Self Care | Payer: BC Managed Care – PPO | Source: Ambulatory Visit | Attending: Radiation Oncology | Admitting: Radiation Oncology

## 2011-10-15 ENCOUNTER — Ambulatory Visit
Admission: RE | Admit: 2011-10-15 | Discharge: 2011-10-15 | Disposition: A | Discharge status: Home / Self Care | Payer: BC Managed Care – PPO | Source: Ambulatory Visit | Attending: Radiation Oncology | Admitting: Radiation Oncology

## 2011-10-16 ENCOUNTER — Ambulatory Visit
Admission: RE | Admit: 2011-10-16 | Discharge: 2011-10-16 | Disposition: A | Discharge status: Home / Self Care | Payer: BC Managed Care – PPO | Source: Ambulatory Visit | Attending: Radiation Oncology | Admitting: Radiation Oncology

## 2011-10-16 DIAGNOSIS — C50119 Malignant neoplasm of central portion of unspecified female breast: Secondary | ICD-10-CM

## 2011-10-16 NOTE — Progress Notes (Signed)
DIAGNOSIS:  Right breast cancer.  NARRATIVE:  Stephanie Malone is seen today for weekly assessment.  She has completed 1980 cGy of a planned 6040 cGy directed at the right breast area.  The patient continues to notice some sensitivity in the breast area but no actual pain, pruritus or significant fatigue.  EXAMINATION:  The patient's weight is 178.4 pounds.  There is no palpable supraclavicular or axillary adenopathy.  Lungs:  Clear.  Heart: Regular rhythm and rate.  Breasts:  Examination of the right breast area reveals the nipple-areolar complex area to be surgically absent.  There is some erythema in the breast area but no significant reaction at this time.  IMPRESSION AND PLAN:  The patient is tolerating her radiation treatments well at this time.  The patient's radiation fields are setting up accurately.  Patient's radiation chart was checked today.  Plan is to continue with breast conservation therapy to a cumulative dose of 6040 cGy.    ______________________________ Billie Lade, Ph.D., M.D. JDK/MEDQ  D:  10/16/2011  T:  10/16/2011  Job:  [id number]

## 2011-10-16 NOTE — Progress Notes (Signed)
Here for routine weekly MD  visit for radiation of 11 of 28 treatments to right breast. No discoloration or visible changes.

## 2011-10-17 ENCOUNTER — Ambulatory Visit
Admission: RE | Admit: 2011-10-17 | Discharge: 2011-10-17 | Disposition: A | Discharge status: Home / Self Care | Payer: BC Managed Care – PPO | Source: Ambulatory Visit | Attending: Radiation Oncology | Admitting: Radiation Oncology

## 2011-10-18 ENCOUNTER — Ambulatory Visit
Admission: RE | Admit: 2011-10-18 | Discharge: 2011-10-18 | Disposition: A | Discharge status: Home / Self Care | Payer: BC Managed Care – PPO | Source: Ambulatory Visit | Attending: Radiation Oncology | Admitting: Radiation Oncology

## 2011-10-19 ENCOUNTER — Ambulatory Visit
Admission: RE | Admit: 2011-10-19 | Discharge: 2011-10-19 | Disposition: A | Discharge status: Home / Self Care | Payer: BC Managed Care – PPO | Source: Ambulatory Visit | Attending: Radiation Oncology | Admitting: Radiation Oncology

## 2011-10-22 ENCOUNTER — Ambulatory Visit
Admission: RE | Admit: 2011-10-22 | Discharge: 2011-10-22 | Disposition: A | Discharge status: Home / Self Care | Payer: BC Managed Care – PPO | Source: Ambulatory Visit | Attending: Radiation Oncology | Admitting: Radiation Oncology

## 2011-10-23 ENCOUNTER — Ambulatory Visit
Admission: RE | Admit: 2011-10-23 | Discharge: 2011-10-23 | Disposition: A | Discharge status: Home / Self Care | Payer: BC Managed Care – PPO | Source: Ambulatory Visit | Attending: Radiation Oncology | Admitting: Radiation Oncology

## 2011-10-23 ENCOUNTER — Encounter: Payer: Self-pay | Admitting: Radiation Oncology

## 2011-10-23 VITALS — Wt 179.8 lb

## 2011-10-23 DIAGNOSIS — C50119 Malignant neoplasm of central portion of unspecified female breast: Secondary | ICD-10-CM

## 2011-10-23 NOTE — Progress Notes (Signed)
Here for routine weekly MD visit for radiation to right breast..Has completed 16 of 33 treatments. Skin pink in some areas. Denies pain. Mild fatigue.

## 2011-10-24 ENCOUNTER — Ambulatory Visit
Admission: RE | Admit: 2011-10-24 | Discharge: 2011-10-24 | Disposition: A | Discharge status: Home / Self Care | Payer: BC Managed Care – PPO | Source: Ambulatory Visit | Attending: Radiation Oncology | Admitting: Radiation Oncology

## 2011-10-24 NOTE — Progress Notes (Signed)
DIAGNOSIS:  Right breast cancer.  NARRATIVE:  [name] is seen today for weekly assessment.  She has completed 2880 cGy of a planned 6040 cGy directed at the right breast area.  The patient has noted some mild fatigue as well as some slight sensitivity in the treatment area.  The patient continues her usual activities.  PHYSICAL EXAMINATION:  The patient's weight is 178.9 pounds. Examination of the lungs reveals them to be clear.  The heart has a regular rhythm and rate.  Examination of the right breast reveals the nipple-areolar complex area to be surgically absent.  There is some erythema throughout the breast and some hyperpigmentation changes, but no skin breakdown is appreciated.  IMPRESSION AND PLAN:  The patient is tolerating her treatments reasonably well except for issues as above.  The patient's radiation fields are setting up accurately.  The patient's radiation chart was checked today.  The patient will continue with breast conservation therapy to a cumulative dose of 6040 cGy.    ______________________________ Billie Lade, Ph.D., M.D. JDK/MEDQ  D:  10/23/2011  T:  10/24/2011  Job:  [id number]

## 2011-10-25 ENCOUNTER — Ambulatory Visit
Admission: RE | Admit: 2011-10-25 | Discharge: 2011-10-25 | Disposition: A | Discharge status: Home / Self Care | Payer: BC Managed Care – PPO | Source: Ambulatory Visit | Attending: Radiation Oncology | Admitting: Radiation Oncology

## 2011-10-26 ENCOUNTER — Ambulatory Visit
Admission: RE | Admit: 2011-10-26 | Discharge: 2011-10-26 | Disposition: A | Discharge status: Home / Self Care | Payer: BC Managed Care – PPO | Source: Ambulatory Visit | Attending: Radiation Oncology | Admitting: Radiation Oncology

## 2011-10-29 ENCOUNTER — Ambulatory Visit
Admission: RE | Admit: 2011-10-29 | Discharge: 2011-10-29 | Disposition: A | Discharge status: Home / Self Care | Payer: BC Managed Care – PPO | Source: Ambulatory Visit | Attending: Radiation Oncology | Admitting: Radiation Oncology

## 2011-10-29 DIAGNOSIS — C50119 Malignant neoplasm of central portion of unspecified female breast: Secondary | ICD-10-CM

## 2011-10-29 NOTE — Procedures (Signed)
DIAGNOSIS:  Right breast cancer.  NARRATIVE:  Earlier today, Mrs. Neider underwent additional planning for radiation therapy directed at the right breast area.  The patient's treatment planning CT scan was reviewed and the patient subsequently had setup of a boost field directed at the site of presentation along the central anterior breast area.  The patient will be treated with custom electron cutout field using 15 MeV electrons prescribed to the 93% isodose line.  PLAN:  The patient is to receive 5 additional treatments at 200 cGy for an additional dose of 1000 cGy and a cumulative dose of 6040 cGy.  A special port plan is requested for treatment.    ______________________________ Billie Lade, Ph.D., M.D. JDK/MEDQ  D:  10/29/2011  T:  10/29/2011  Job:  [id number]

## 2011-10-30 ENCOUNTER — Ambulatory Visit
Admission: RE | Admit: 2011-10-30 | Discharge: 2011-10-30 | Disposition: A | Discharge status: Home / Self Care | Payer: BC Managed Care – PPO | Source: Ambulatory Visit | Attending: Radiation Oncology | Admitting: Radiation Oncology

## 2011-10-30 ENCOUNTER — Encounter: Payer: Self-pay | Admitting: Radiation Oncology

## 2011-10-30 DIAGNOSIS — C50119 Malignant neoplasm of central portion of unspecified female breast: Secondary | ICD-10-CM

## 2011-10-30 NOTE — Progress Notes (Signed)
Here for routine weekly MD visit for radiation of right breast. Has completed 21 of 33 treatments. Has developed a follicular reaction with some itching of this area. Knows to try hydrocortisone 1% if needed.

## 2011-10-30 NOTE — Progress Notes (Signed)
DIAGNOSIS:  Right breast cancer.  NARRATIVE:  Stephanie Malone is seen today for weekly assessment.  She has completed 3780 cGy of a planned 6040 cGy directed at the right breast area.  The patient has noted some mild fatigue as well as some pruritis  in the treatment area.  The patient continues her usual activities.  PHYSICAL EXAMINATION:  The patient's weight is 179 pounds. Examination of the lungs reveals them to be clear.  The heart has a regular rhythm and rate.  Examination of the right breast reveals the nipple-areolar complex area to be surgically absent.  There is some erythema throughout the breast and some hyperpigmentation changes, but no skin breakdown is appreciated.  IMPRESSION AND PLAN:  The patient is tolerating her treatments reasonably well except for issues as above.  The patient's radiation fields are setting up accurately.  The patient's radiation chart was checked today.  The patient will continue with breast conservation therapy to a cumulative dose of 6040 cGy. She knows to try hydrocortisone cream if itching worsens.    ______________________________ Billie Lade, Ph.D., M.D. JDK/MEDQ  D:  10/23/2011  T:  10/24/2011  Job:  [id number]

## 2011-10-31 ENCOUNTER — Ambulatory Visit
Admission: RE | Admit: 2011-10-31 | Discharge: 2011-10-31 | Disposition: A | Discharge status: Home / Self Care | Payer: BC Managed Care – PPO | Source: Ambulatory Visit | Attending: Radiation Oncology | Admitting: Radiation Oncology

## 2011-10-31 ENCOUNTER — Ambulatory Visit: Payer: BC Managed Care – PPO | Admitting: Nutrition

## 2011-10-31 NOTE — Assessment & Plan Note (Signed)
Stephanie Malone called me to request nutrition information.  She is a 56 year old breast cancer patient and she would like to receive information about a healthy diet.  She was also interested in knowing if I would counsel her on weight loss after treatment.  NUTRITION DIAGNOSIS:  Food and nutrition related knowledge deficit related to breast cancer and associated treatments as evidenced by no prior need for nutrition related information.  INTERVENTION:  I have explained to Stephanie Malone I would be more than happy to meet with her to discuss her questions and concerns.  However, the patient would prefer to have information mailed to her at this time.  I explained to her that I am unable to spend ongoing time with patient counseling for weight loss; however, I would be more than happy to provide some initial information and refer her to an outpatient center for weight loss counseling.  The patient is a lifelong [employer] member and verbalizes that she knows what she needs to do but she is unwilling to pay for nutrition counseling for weight loss at this time so was hoping that I will be seeing her longtime for free.  I have offered again to schedule appointment with the patient; however, she has declined.  Therefore, she is interested in me printing off and providing her with information she can read on her own which I have done.  I have encouraged her to please review information.  I have discussed some brief nutrition intervention she can begin to make to incorporate more fruits and vegetables and whole grains into her diet.  We talked about the importance of exercise in her weight loss journey.  I have included my contact information for questions or concerns.  The patient was satisfied with information.  MONITORING/EVALUATION (GOALS):  That the patient will tolerate a healthy plant-based diet to help her achieve a healthier body weight and minimize risk for recurrence.  NEXT VISIT:  There is no followup  scheduled.  However, the patient understands to call me with questions or concerns.    ______________________________ Zenovia Jarred, RD, LDN Clinical Nutrition Specialist BN/MEDQ  D:  10/31/2011  T:  10/31/2011  Job:  [id number]

## 2011-11-01 ENCOUNTER — Ambulatory Visit
Admission: RE | Admit: 2011-11-01 | Discharge: 2011-11-01 | Disposition: A | Discharge status: Home / Self Care | Payer: BC Managed Care – PPO | Source: Ambulatory Visit | Attending: Radiation Oncology | Admitting: Radiation Oncology

## 2011-11-02 ENCOUNTER — Ambulatory Visit
Admission: RE | Admit: 2011-11-02 | Discharge: 2011-11-02 | Disposition: A | Discharge status: Home / Self Care | Payer: BC Managed Care – PPO | Source: Ambulatory Visit | Attending: Radiation Oncology | Admitting: Radiation Oncology

## 2011-11-05 ENCOUNTER — Ambulatory Visit
Admission: RE | Admit: 2011-11-05 | Discharge: 2011-11-05 | Disposition: A | Discharge status: Home / Self Care | Payer: BC Managed Care – PPO | Source: Ambulatory Visit | Attending: Radiation Oncology | Admitting: Radiation Oncology

## 2011-11-06 ENCOUNTER — Ambulatory Visit: Payer: BC Managed Care – PPO

## 2011-11-07 ENCOUNTER — Ambulatory Visit
Admission: RE | Admit: 2011-11-07 | Discharge: 2011-11-07 | Disposition: A | Discharge status: Home / Self Care | Payer: BC Managed Care – PPO | Source: Ambulatory Visit | Attending: Radiation Oncology | Admitting: Radiation Oncology

## 2011-11-07 DIAGNOSIS — C50119 Malignant neoplasm of central portion of unspecified female breast: Secondary | ICD-10-CM

## 2011-11-07 NOTE — Progress Notes (Signed)
Here for routine under treat visit of right breast. Has completed  26 of 33 treatments. Follicular reaction of chest with itching. Uses  Hydrocortisone 1%. Mild fatigue. Continue with radiaplex 2 to 3 times daily.

## 2011-11-07 NOTE — Progress Notes (Signed)
DIAGNOSIS:  Right breast cancer.  NARRATIVE:  Stephanie Malone is seen today for weekly assessment.  She has completed 4680 cGy of a planned 6040 cGy directed at the right breast area.  The patient has noticed some pruritus within the upper aspect of the breast and some mild fatigue but is able to carry on her usual activities.  EXAMINATION:  Lungs:  Clear.  Heart:  Regular rhythm and rate. Lymphatic:  There is no palpable supraclavicular or axillary adenopathy. Breasts:  Examination of the right breast reveals some hyperpigmentation changes and erythema, particularly in the upper inner aspect of the breast and the inframammary fold.  There is no moist desquamation noted.  IMPRESSION AND PLAN:  The patient is tolerating her treatments reasonably well except for issues as above.  The patient's radiation fields are setting up accurately.  The patient's radiation chart was checked today.  Plan is to continue with breast conservation therapy to a cumulative dose of 6040 cGy.    ______________________________ Billie Lade, Ph.D., M.D. JDK/MEDQ  D:  11/07/2011  T:  11/07/2011  Job:  [id number]

## 2011-11-08 ENCOUNTER — Ambulatory Visit
Admission: RE | Admit: 2011-11-08 | Discharge: 2011-11-08 | Disposition: A | Discharge status: Home / Self Care | Payer: BC Managed Care – PPO | Source: Ambulatory Visit | Attending: Radiation Oncology | Admitting: Radiation Oncology

## 2011-11-09 ENCOUNTER — Ambulatory Visit
Admission: RE | Admit: 2011-11-09 | Discharge: 2011-11-09 | Disposition: A | Discharge status: Home / Self Care | Payer: BC Managed Care – PPO | Source: Ambulatory Visit | Attending: Radiation Oncology | Admitting: Radiation Oncology

## 2011-11-12 ENCOUNTER — Ambulatory Visit
Admission: RE | Admit: 2011-11-12 | Discharge: 2011-11-12 | Disposition: A | Discharge status: Home / Self Care | Payer: BC Managed Care – PPO | Source: Ambulatory Visit | Attending: Radiation Oncology | Admitting: Radiation Oncology

## 2011-11-12 DIAGNOSIS — C50119 Malignant neoplasm of central portion of unspecified female breast: Secondary | ICD-10-CM

## 2011-11-13 ENCOUNTER — Ambulatory Visit
Admission: RE | Admit: 2011-11-13 | Discharge: 2011-11-13 | Disposition: A | Discharge status: Home / Self Care | Payer: BC Managed Care – PPO | Source: Ambulatory Visit | Attending: Radiation Oncology | Admitting: Radiation Oncology

## 2011-11-14 ENCOUNTER — Ambulatory Visit
Admission: RE | Admit: 2011-11-14 | Discharge: 2011-11-14 | Disposition: A | Discharge status: Home / Self Care | Payer: BC Managed Care – PPO | Source: Ambulatory Visit | Attending: Radiation Oncology | Admitting: Radiation Oncology

## 2011-11-14 ENCOUNTER — Encounter: Payer: Self-pay | Admitting: Radiation Oncology

## 2011-11-14 VITALS — Wt 177.6 lb

## 2011-11-14 DIAGNOSIS — C50119 Malignant neoplasm of central portion of unspecified female breast: Secondary | ICD-10-CM

## 2011-11-14 MED ORDER — RADIAPLEXRX EX GEL
Freq: Once | CUTANEOUS | Status: AC
  Administered 2011-11-14: 1 via TOPICAL

## 2011-11-14 NOTE — Progress Notes (Signed)
DIAGNOSIS:  Right breast cancer.  NARRATIVE:  Stephanie Malone is seen today for weekly assessment.  She has completed 5,640 cGy of a planned 6,040 cGy directed at the right breast area.  The patient is currently on her boost field.  The patient has some mild fatigue as well as mild pruritus and discomfort in the breast area but overall seems to be tolerating her treatments well.  PHYSICAL EXAMINATION:  Vital Signs:  The patient's weight is 177 pounds, which is stable.  Lungs:  Clear.  Heart:  Regular rhythm and rate. Breasts:  Examination right breast reveals some erythema and hyperpigmentation changes as well as some dry desquamation.  There is no moist desquamation noted.  The patient has marking in place for her electron boost field directed at the central anterior breast.  IMPRESSION AND PLAN:  The patient is tolerating her treatments well at this time.  The patient's radiation fields are setting up accurately. The patient's radiation chart was checked today.  The plan is to continue with breast conservation therapy to a cumulative dose of 6,040 cGy.    ______________________________ Billie Lade, Ph.D., M.D. JDK/MEDQ  D:  11/14/2011  T:  11/14/2011  Job:  [id number]

## 2011-11-14 NOTE — Progress Notes (Signed)
Here for routine weekly under treat visit for right breast ca.Skin is red, with follicular rash. Completes radiation on Friday. To continue application of radiaplex.

## 2011-11-15 ENCOUNTER — Ambulatory Visit
Admission: RE | Admit: 2011-11-15 | Discharge: 2011-11-15 | Disposition: A | Discharge status: Home / Self Care | Payer: BC Managed Care – PPO | Source: Ambulatory Visit | Attending: Radiation Oncology | Admitting: Radiation Oncology

## 2011-11-16 ENCOUNTER — Ambulatory Visit
Admission: RE | Admit: 2011-11-16 | Discharge: 2011-11-16 | Disposition: A | Discharge status: Home / Self Care | Payer: BC Managed Care – PPO | Source: Ambulatory Visit | Attending: Radiation Oncology | Admitting: Radiation Oncology

## 2011-11-20 ENCOUNTER — Encounter (INDEPENDENT_AMBULATORY_CARE_PROVIDER_SITE_OTHER): Payer: Self-pay | Admitting: Surgery

## 2011-11-20 NOTE — Progress Notes (Signed)
CC:   Currie Paris, M.D. Lowella Dell, M.D. Larina Earthly, M.D.  DIAGNOSIS:  Stage I right breast cancer.  INDICATION FOR THERAPY:  Breast conservation.  TREATMENT DATES:  October 02, 2011, through November 16, 2011.  SITE/DOSE:  Right breast, 5040 cGy in 28 fractions (180 cGy per fraction).  The site of presentation along the anterior central aspect of the breast area was boosted further to a cumulative dose of 6040 cGy.  ENERGY/FIELD:  The patient was initially treated with tangential beams encompassing the right breast.  Forward planning was used to improve the dose homogeneity.  The patient was treated with combination of 6 and 18 MV photons.  After 28 treatments, the patient underwent therapy directed at the site of presentation within the anterior central aspect of the breast area.  The patient was treated with a custom electron cutout field using 15 MeV electrons prescribed to the 93% isodose line.  The patient received 5 additional treatments at 200 cGy per day for an additional dose of 1000 cGy and a cumulative dose of 6040 cGy.  NARRATIVE:  Ms. Helwig tolerated her radiation therapy well.  She toward the end of treatment did experience some fatigue as well as some pruritus and mild discomfort within the breast area.  The patient did not experience any moist desquamation.  FOLLOWUP APPOINTMENT:  1 month.    ______________________________ Billie Lade, Ph.D., M.D. JDK/MEDQ  D:  11/19/2011  T:  11/20/2011  Job:  [id number]

## 2011-11-23 ENCOUNTER — Encounter (INDEPENDENT_AMBULATORY_CARE_PROVIDER_SITE_OTHER): Payer: Self-pay | Admitting: Surgery

## 2011-11-23 ENCOUNTER — Ambulatory Visit (INDEPENDENT_AMBULATORY_CARE_PROVIDER_SITE_OTHER): Payer: BC Managed Care – PPO | Admitting: Surgery

## 2011-11-23 VITALS — BP 124/86 | HR 76 | Temp 97.7°F | Resp 12 | Ht 65.5 in | Wt 176.6 lb

## 2011-11-23 DIAGNOSIS — C50119 Malignant neoplasm of central portion of unspecified female breast: Secondary | ICD-10-CM

## 2011-11-23 NOTE — Progress Notes (Signed)
NAME: Stephanie Malone       DOB: 1956-06-21           DATE: 11/23/2011       MRN: [id number]   Stephanie Malone is a 57 y.o.Marland Kitchenfemale who presents for routine followup of her Right breast cancer diagnosed in 2012 and treated with partial central mastectomy, radiation,now on Tamoxifen. She has no problems or concerns on either side.  PFSH: She has had no significant changes since the last visit here.  ROS: There have been no significant changes since the last visit here  EXAM: General: The patient is alert, oriented, generally healty appearing, NAD. Mood and affect are normal.  Breasts:  Right breast shows central erythema but minimal thickening  Lymphatics: She has no axillary or supraclavicular adenopathy on either side.  Extremities: Full ROM of the surgical side with no lymphedema noted.  Data Reviewed: Rad notes have been reviewed  Impression: Doing well, with no evidence of recurrent cancer or new cancer  Plan: Will continue to follow up in six months. She needs a mammogram in October. Gave her names of [facility] and Odis Luster for plastic surgery consult  .

## 2011-11-23 NOTE — Patient Instructions (Signed)
See me again in six months. Go ahead and see a plastic surgeon so you can make plans for a reconstruction late this year or early next year. Have your mammogram in October and ask them to send me a copy.

## 2011-12-12 ENCOUNTER — Encounter: Payer: Self-pay | Admitting: Radiation Oncology

## 2011-12-12 ENCOUNTER — Encounter: Payer: Self-pay | Admitting: *Deleted

## 2011-12-12 ENCOUNTER — Ambulatory Visit
Admission: RE | Admit: 2011-12-12 | Discharge: 2011-12-12 | Disposition: A | Discharge status: Home / Self Care | Payer: BC Managed Care – PPO | Source: Ambulatory Visit | Attending: Radiation Oncology | Admitting: Radiation Oncology

## 2011-12-12 DIAGNOSIS — C50119 Malignant neoplasm of central portion of unspecified female breast: Secondary | ICD-10-CM

## 2011-12-12 NOTE — Progress Notes (Signed)
Pt alert,oriented times 3, skin well healed,mild hyperpigmentation, started tamoxifen 20mg  daily March 2nd,2013, mild hot flashes now, occasional pain under arm where lymph nodes removed,denys pain at present 10:25 AM

## 2011-12-12 NOTE — Progress Notes (Signed)
CC:   Stephanie Malone, M.D. Stephanie Malone, M.D. Stephanie Malone, M.D.  DIAGNOSIS:  Stage I right breast cancer.  INTERVAL SINCE RADIATION THERAPY:  1 month.  NARRATIVE:  [name] comes in today for routine followup.  She clinically seems to be doing well at this time.  She has minimal fatigue at this time.  She denies any significant itching or discomfort in the breast area.  She had some mild discomfort in the axillary area.  The patient has started on tamoxifen.  She has noticed some hot flashes with this medication.  EXAMINATION:  Temp is 97.5, pulse 70, blood pressure 143/83.  Weight is 180 pounds.  Examination of the neck and supraclavicular region reveals no evidence of adenopathy.  The axillary areas are free of adenopathy. Examination of the lungs reveals them to be clear.  The heart has a regular rhythm and rate.  Examination of the left breast reveals no mass or nipple discharge.  Examination right breast reveals some mild hyperpigmentation changes particularly centrally within the breast.  The nipple-areolar complex area is surgically absent.  There is mild induration at the lumpectomy scar but no dominant mass is appreciated in the breast.  IMPRESSION AND PLAN:  The patient is doing well 1 month out from her treatment.  As above, she will continue on adjuvant hormonal therapy. Routine followup in Radiation Oncology in 6 months.    ______________________________ Billie Lade, Ph.D., M.D. JDK/MEDQ  D:  12/12/2011  T:  12/12/2011  Job:  [id number]

## 2011-12-12 NOTE — Progress Notes (Signed)
Encounter addended by: Billie Lade, MD on: 12/12/2011  4:37 PM<BR>     Documentation filed: Visit Diagnoses

## 2011-12-17 ENCOUNTER — Ambulatory Visit: Payer: BC Managed Care – PPO | Admitting: Radiation Oncology

## 2011-12-24 ENCOUNTER — Ambulatory Visit: Payer: BC Managed Care – PPO | Admitting: Radiation Oncology

## 2012-01-10 ENCOUNTER — Other Ambulatory Visit: Payer: Self-pay | Admitting: Dermatology

## 2012-01-28 ENCOUNTER — Other Ambulatory Visit (HOSPITAL_BASED_OUTPATIENT_CLINIC_OR_DEPARTMENT_OTHER): Payer: BC Managed Care – PPO | Admitting: Lab

## 2012-01-28 DIAGNOSIS — C50119 Malignant neoplasm of central portion of unspecified female breast: Secondary | ICD-10-CM

## 2012-01-28 LAB — CBC WITH DIFFERENTIAL/PLATELET
Basophils Absolute: 0 10*3/uL (ref 0.0–0.1)
Eosinophils Absolute: 0.2 10*3/uL (ref 0.0–0.5)
HGB: 14.1 g/dL (ref 11.6–15.9)
LYMPH%: 18.2 % (ref 14.0–49.7)
MCV: 87.7 fL (ref 79.5–101.0)
MONO#: 0.3 10*3/uL (ref 0.1–0.9)
MONO%: 4.8 % (ref 0.0–14.0)
NEUT#: 4.5 10*3/uL (ref 1.5–6.5)
Platelets: 265 10*3/uL (ref 145–400)
RBC: 4.6 10*6/uL (ref 3.70–5.45)
WBC: 6 10*3/uL (ref 3.9–10.3)
nRBC: 0 % (ref 0–0)

## 2012-01-28 LAB — COMPREHENSIVE METABOLIC PANEL
ALT: 18 U/L (ref 0–35)
CO2: 27 mEq/L (ref 19–32)
Calcium: 9.2 mg/dL (ref 8.4–10.5)
Chloride: 106 mEq/L (ref 96–112)
Creatinine, Ser: 0.81 mg/dL (ref 0.50–1.10)
Glucose, Bld: 142 mg/dL — ABNORMAL HIGH (ref 70–99)
Sodium: 141 mEq/L (ref 135–145)
Total Bilirubin: 0.3 mg/dL (ref 0.3–1.2)
Total Protein: 6.5 g/dL (ref 6.0–8.3)

## 2012-02-04 ENCOUNTER — Ambulatory Visit (HOSPITAL_BASED_OUTPATIENT_CLINIC_OR_DEPARTMENT_OTHER): Payer: BC Managed Care – PPO | Admitting: Oncology

## 2012-02-04 ENCOUNTER — Telehealth: Payer: Self-pay | Admitting: *Deleted

## 2012-02-04 VITALS — BP 128/83 | HR 76 | Temp 97.5°F | Ht 65.5 in | Wt 180.0 lb

## 2012-02-04 DIAGNOSIS — C50019 Malignant neoplasm of nipple and areola, unspecified female breast: Secondary | ICD-10-CM

## 2012-02-04 DIAGNOSIS — Z170 Estrogen receptor positive status [ER+]: Secondary | ICD-10-CM

## 2012-02-04 DIAGNOSIS — C50119 Malignant neoplasm of central portion of unspecified female breast: Secondary | ICD-10-CM

## 2012-02-04 MED ORDER — TAMOXIFEN CITRATE 20 MG PO TABS: 20.0000 mg | ORAL_TABLET | Freq: Every day | ORAL | Status: DC

## 2012-02-04 NOTE — Telephone Encounter (Signed)
gave patient appointment for one year with the labs one week before the md appointments

## 2012-02-04 NOTE — Progress Notes (Signed)
CC:   Stephanie Malone, M.D. Stephanie Malone, M.D. Stephanie Malone, Ph.D., M.D. Stephanie Bang, MD Stephanie Malone, M.D.  HPI:  The patient had an unremarkable screening mammogram in June of 2011 at the [facility].  However, she noted what looked to her like nipple inversion.  She brought this to Dr. Vicente Males attention and he set her up for diagnostic mammography at the [facility], July 09, 2011.  This showed a heterogeneously dense breast pattern and new mild inversion of the right nipple.  There was also subtle distortion in the lateral subareolar portion of the right breast. Mammography also showed a small nodule medially within the right breast which was an interval change.  The left breast was unremarkable.  On physical exam, Dr. Jean Malone noted the nipple inversion but no palpable mass and no palpable right axillary adenopathy.  Ultrasonography showed an irregularly marginated hypoechoic mass within the subareolar portion of the right breast at the 9 o'clock position measuring 1.3 cm.  There was also an intramammary lymph node with asymmetrical cortical thickening at the 4 o'clock position in the right breast.  This measured 6 mm.  The ultrasound of the axilla was unremarkable.  With this information, ultrasound-guided core biopsy of the right breast mass was performed.  On the initial pass, the mass collapsed consistent with a complicated cyst.  The pathology from this procedure ([id number]) showed an invasive mammary carcinoma, grade 1 which was strongly estrogen and progesterone receptor positive at 99% and 100% respectively.  The MIB-1 was 3%.  There was no amplification of HER-2 by CISH, the ratio being 1.43.  Bilateral breast MRIs were obtained July 13, 2011.  This again showed the right lateral sub areolar mass measuring 1.5 cm.  The other area of the right breast, which had also been previously biopsied, had been shown to be benign. The patient proceeded  to definitive Right lumpectomy and sentinel lymph node sampling August 15, 2011. Her subsequent history is as detailed below.  Interval history: Stephanie Malone returns today for followup of her breast cancer. Since her last visit here she completed her radiation treatments. She "sailed through it", with minimal skin erythema and no significant fatigue. She started tamoxifen approximately 2 and half months ago.  ROS: She initially had hot flashes almost every hour on tamoxifen, but they have abated and currently she has 2 or 3 hot flashes a day. They are not troublesome enough for her to want to take any medication for it. She does have some vaginal dryness and she will like to use vaginal estrogens for that. I have no problems with it so long as she remains on tamoxifen. She will be discussing all that with Dr. Nicholas Malone at their upcoming visit.Stephanie Malone does have a basal cell on the bridge of her nose on the right. She is going to have this removed tomorrow under Dr. Irene Malone. She is planning to complete her reconstruction or under Dr. Odis Malone over the next 2 months and active have of an evaluation for possible esophageal spasms under Stephanie Malone sometime in the near future.Otherwise a detailed review of systems today was noncontributory  PAST MEDICAL HISTORY:  Significant for migraines and degenerative disk disease.  She is status post pelvic floor repair and Burch procedure in 1997 and hysterectomy with further pelvic floor repair in May of 2012. This was a simple hysterectomy with no salpingo-oophorectomy and benign pathology ([id number]).  FAMILY HISTORY:  The patient's father is alive at age  21.  The patient's mother died at age 10.  She had 1 brother, no sisters.  There is no history of breast or ovarian cancer in the family.  The father's mother may have had multiple myeloma.  The father himself has a history of hepatocellular cancer diagnosed a year ago, and 1 of the patient's father's brothers had  pancreatic cancer.  GYNECOLOGIC HISTORY:  She had menarche age 54.  Last menstrual period was February of this year.  She has been using hormone replacement for the past 2 years but stopped at the time of her breast cancer diagnosis.  She is GX, P3, 1st pregnancy to term at age 47, which she understands is a risk factor for breast cancer.  The patient used oral contraceptives for approximately 5 years.  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  Her husband, Stephanie Malone, is present today.  Their sons are Stephanie Malone, a 27 year old, lives in [place] and is a Consulting civil engineer; Stephanie Malone, 48, attends [employer] as a Consulting civil engineer; and Stephanie Malone is at home where she, of course, goes to high school.  HEALTH MAINTENANCE:  The patient smoked minimally, perhaps as much as 5- pack-years.  She quit in 1995.  She does not use alcohol.  She had a colonoscopy in October of 2011, a bone density in 2010 which was normal (there is a copy in the chart), most recent Pap smear September of 2011.  ALLERGIES:  No known drug allergies.  MEDICATIONS:  She takes Imitrex as needed, Phenergan as needed, trazodone at bedtime, and Xanax as needed.  PHYSICAL EXAM:  middle-aged white woman in no acute distress Filed Vitals:   02/04/12 1028  BP: 128/83  Pulse: 76  Temp: 97.5 F (36.4 C)   Sclerae are not icteric.  Oropharynx is clear.  I do not palpate any peripheral adenopathy including in the right axilla.  Lungs:  No crackles or wheezes.  Heart:  Regular rate and rhythm.  No murmur appreciated. Breasts:  Right breast is status post central lumpectomy. The nipple has not yet been reconstructed. There is no evidence of local recurrence. The left side is entirely unremarkable.  Abdomen:  Benign. Musculoskeletal:  No focal spinal tenderness.  No peripheral edema. Neurologic:  Nonfocal.  LAB WORK:  Shows a completely normal CBC and C-MET except for a nonfasting glucose of 142. \ FILMS: No new studies  found.  IMPRESSION:  A 56 year old [place] woman status post right central lumpectomy November of 2012 for a T1c N0 (Stage IA) invasive ductal carcinoma, grade 1, strongly estrogen and progesterone receptor positive with no HER-2 amplification, and an MIB-1 of 3%; the patient's ONCOTYPE DX reports a recurrence score of 7, predicting a risk of distant recurrence of 6% if she takes tamoxifen for 5 years; she completed radiation March 2013 at which point she started tamoxifen.  PLAN: She is doing well with tamoxifen and the plan is to continue that for a minimum of 2 years. At that point we can decide whether to go to an aromatase inhibitor on oral continuing on tamoxifen for 5-10 years total. While on tamoxifen I have no problem with her using estrogen for vaginal dryness issues and she will be discussing this further with Dr. Nicholas Malone.  Lyndsie will see Dr. Jamey Malone in November. She will see me again in one year. Her next mammography will be in October of this year. She knows to call for any problems that may develop before the next visit ______________________________  Stephanie Malone, M.D. Stephanie Malone  D:  07/20/2011  T:  07/23/2011  Job:  [id number]

## 2012-02-13 NOTE — Progress Notes (Signed)
Encounter addended by: Glennie Hawk, RN on: 02/13/2012  1:45 PM<BR>     Documentation filed: Charges VN

## 2012-03-10 ENCOUNTER — Other Ambulatory Visit: Payer: Self-pay | Admitting: Gynecology

## 2012-03-13 ENCOUNTER — Encounter: Payer: Self-pay | Admitting: Internal Medicine

## 2012-03-24 ENCOUNTER — Telehealth: Payer: Self-pay | Admitting: *Deleted

## 2012-03-24 DIAGNOSIS — R1310 Dysphagia, unspecified: Secondary | ICD-10-CM

## 2012-03-24 NOTE — Telephone Encounter (Signed)
Pt is the daughter of June Auman and is scheduled to see you on 7//31/13. She has spoken with you at her dad's appt about swallowing issues and you mentioned maybe doing a swallowing study with tablet. Would you like to order this prior to her visit on 04/16/12 if possible? Thanks.

## 2012-03-26 NOTE — Telephone Encounter (Signed)
Yes, barium swallow with tablet please, before OV.

## 2012-03-26 NOTE — Telephone Encounter (Signed)
Informed pt of her BS appt; pt stated understanding.

## 2012-03-26 NOTE — Telephone Encounter (Signed)
lmom for pt to call back. I scheduled pt for 04/01/12 at 10:30am, be there at 10:15am and there is no prep.

## 2012-04-01 ENCOUNTER — Ambulatory Visit (HOSPITAL_COMMUNITY)
Admission: RE | Admit: 2012-04-01 | Discharge: 2012-04-01 | Disposition: A | Discharge status: Home / Self Care | Payer: BC Managed Care – PPO | Source: Ambulatory Visit | Attending: Internal Medicine | Admitting: Internal Medicine

## 2012-04-01 DIAGNOSIS — K224 Dyskinesia of esophagus: Secondary | ICD-10-CM | POA: Insufficient documentation

## 2012-04-01 DIAGNOSIS — R1310 Dysphagia, unspecified: Secondary | ICD-10-CM | POA: Insufficient documentation

## 2012-04-14 ENCOUNTER — Encounter: Payer: Self-pay | Admitting: Internal Medicine

## 2012-04-16 ENCOUNTER — Ambulatory Visit (INDEPENDENT_AMBULATORY_CARE_PROVIDER_SITE_OTHER): Payer: BC Managed Care – PPO | Admitting: Internal Medicine

## 2012-04-16 ENCOUNTER — Encounter: Payer: Self-pay | Admitting: Internal Medicine

## 2012-04-16 VITALS — BP 112/70 | HR 72 | Ht 65.5 in | Wt 163.4 lb

## 2012-04-16 DIAGNOSIS — K224 Dyskinesia of esophagus: Secondary | ICD-10-CM

## 2012-04-16 MED ORDER — ESOMEPRAZOLE MAGNESIUM 40 MG PO PACK: 40.0000 mg | PACK | Freq: Every day | ORAL | Status: DC

## 2012-04-16 NOTE — Patient Instructions (Addendum)
We have sent the following medications to your pharmacy for you to pick up at your convenience: Nexium, please take as directed

## 2012-04-16 NOTE — Progress Notes (Signed)
Patient ID: Stephanie Malone, female   DOB: [birth year], 56 y.o.   MRN: [id number]  SUBJECTIVE: HPI Stephanie Malone is a 56 yo female with PMH of breast cancer, migraines, DDD, asthma who is seen for evaluation of intermittent dysphagia.  The patient reports intermittent esophageal dysphagia symptoms which been present on and off for several years. She reports that this occurs irregularly and can occur with solids and liquids. It actually happens more with liquids. She reports that at times she will drink fluid and it feels like it stops in her mid chest and is forced back up. There are other times when she feels an esophageal spasm-type discomfort in her mid chest. She's not having trouble with heartburn, but she did have issues with heartburn during her pregnancies. She denies nausea and vomiting. Her appetite has been normal. She has lost approximately 10 pounds which is intentional.  She does occasionally report globus sensation but this is not constant. No abdominal pain. No change in bowel habits. She did have colonoscopy performed in [place] and 2011. She recalls no polyps and diverticulosis. He was to repeat in 10 years. No odynophagia.  Review of Systems  As per history of present illness, otherwise negative   Past Medical History  Diagnosis Date  . Breast cancer, IDC, Right, ER+,PR+, HER2- 07/10/2011  . Migraine   . Degenerative lumbar disc   . Anxiety   . Chronic back pain   . Asthma     Current Outpatient Prescriptions  Medication Sig Dispense Refill  . ALPRAZolam (XANAX) 0.5 MG tablet Take 0.5 mg by mouth at bedtime as needed.        . naproxen sodium (ANAPROX) 220 MG tablet Take 220 mg by mouth as needed.       . promethazine (PHENERGAN) 25 MG tablet Take 25 mg by mouth every 6 (six) hours as needed.        . SUMAtriptan (IMITREX) 100 MG tablet Take 100 mg by mouth as needed.        . tamoxifen (NOLVADEX) 20 MG tablet Take 1 tablet (20 mg total) by mouth daily. Started 2nd march,  2013,  90 tablet  12  . traZODone (DESYREL) 100 MG tablet Take 100 mg by mouth at bedtime.        Marland Kitchen esomeprazole (NEXIUM) 40 MG packet Take 40 mg by mouth daily before breakfast.  30 each  1    No Known Allergies  Family History  Problem Relation Age of Onset  . Liver cancer Father   . Pancreatic cancer Paternal Uncle   . Multiple myeloma Paternal Grandmother   . Stroke Mother   . Cirrhosis Father   . Colon polyps Father   . Heart disease Mother   . Diabetes Mother   . Diabetes Father     History  Substance Use Topics  . Smoking status: Former Smoker -- 0.3 packs/day for 12 years    Types: Cigarettes    Quit date: 08/27/1993  . Smokeless tobacco: Never Used  . Alcohol Use: Yes     wine cooler once evey 2-3 months    OBJECTIVE: BP 112/70  Pulse 72  Ht 5' 5.5" (1.664 m)  Wt 163 lb 6 oz (74.106 kg)  BMI 26.77 kg/m2 Constitutional: Well-developed and well-nourished. No distress. HEENT: Normocephalic and atraumatic. Oropharynx is clear and moist. No oropharyngeal exudate. Conjunctivae are normal. Pupils are equal round and reactive to light. No scleral icterus. Neck: Neck supple. Trachea midline. Cardiovascular: Normal rate,  regular rhythm and intact distal pulses. No M/R/G Pulmonary/chest: Effort normal and breath sounds normal. No wheezing, rales or rhonchi. Abdominal: Soft, nontender, nondistended. Bowel sounds active throughout. There are no masses palpable. No hepatosplenomegaly. Extremities: no clubbing, cyanosis, or edema Lymphadenopathy: No cervical adenopathy noted. Neurological: Alert and oriented to person place and time. Skin: Skin is warm and dry. No rashes noted. Psychiatric: Normal mood and affect. Behavior is normal.  Labs and Imaging --   Clinical Data: Difficulty swallowing.  Intermittently for 2 years. Symptoms in the mid chest.   ESOPHOGRAM/BARIUM SWALLOW July 2013   Technique:  Combined double contrast and single contrast examination performed  using effervescent crystals, thick barium liquid, and thin barium liquid.   Fluoroscopy time:  1.5 minutes.   Comparison:  None.   Findings:  Hypopharyngeal portion exam demonstrates no significant findings.   Double contrast evaluation of the esophagus demonstrates no mucosal abnormality.   Evaluation of primary peristalsis demonstrates intermittent incomplete primary peristaltic waves with contrast stasis in the lower thoracic esophagus and minimal escape waves.   Full column evaluation the esophagus demonstrates no persistent narrowing or stricture.  13 mm barium tablet passes promptly.   IMPRESSION:   1.  Mild esophageal dysmotility, likely presbyesophagus. 2.  No other explanation for patient's symptoms.   Findings discussed with the patient prior to this dictation.   ASSESSMENT AND PLAN: 56 yo female with PMH of breast cancer, migraines, DDD, asthma who is seen for evaluation of intermittent dysphagia.  1. Esophageal dysphagia -- the patient's esophageal complaints are intermittent and her barium swallow revealed some mild dysmotility. There were no mass lesions and no strictures. It is very likely that she does have intermittent esophageal dysmotility which is the cause of her symptoms. She certainly could also have occasional esophageal spasm. It is unlikely that reflux disease is contributing to her overall symptoms, but given her intermittent globus, we will try PPI. She will take Nexium 40 mg daily. She's instructed to take this 30 minutes prior to breakfast. She will let us know if she has any benefit in her esophageal symptoms. She has not benefit after one month and I recommend discontinuation of this medication. Given that no definite stricture was seen, I do not think upper endoscopy with dilation would be helpful. We have discussed this today. She understands. I also think that eosinophilic esophagitis is very low in the differential given the periodic nature of her  symptoms. For intermittent esophageal spasm, I have recommended peppermint altoids, as peppermint oil can relax esophageal smooth muscle. If this comes a more significant or frequent issue, other options include low-dose calcium channel blocker or periodic sublingual nitroglycerin glycerin.  She will call to let us know how she is doing and can be seen as needed.  2. CRC screening -- patient had colonoscopy 2011 and was recommended a repeat 2021.

## 2012-05-21 ENCOUNTER — Ambulatory Visit (INDEPENDENT_AMBULATORY_CARE_PROVIDER_SITE_OTHER): Payer: BC Managed Care – PPO | Admitting: Surgery

## 2012-05-21 ENCOUNTER — Encounter (INDEPENDENT_AMBULATORY_CARE_PROVIDER_SITE_OTHER): Payer: Self-pay | Admitting: Surgery

## 2012-05-21 VITALS — BP 110/64 | HR 76 | Temp 97.3°F | Resp 16 | Ht 65.5 in | Wt 171.4 lb

## 2012-05-21 DIAGNOSIS — Z853 Personal history of malignant neoplasm of breast: Secondary | ICD-10-CM

## 2012-05-21 NOTE — Patient Instructions (Signed)
See me again in six months 

## 2012-05-21 NOTE — Progress Notes (Signed)
NAME: Stephanie Malone       DOB: 17-Oct-1955           DATE: 05/21/2012       MRN: [id number]   Stephanie Malone is a 56 y.o.Marland Kitchenfemale who presents for routine followup of her Right breast cancer diagnosed in 2012 and treated with partial central mastectomy, radiation,now on Tamoxifen. She has no problems or concerns on either side.  PFSH: She has had no significant changes since the last visit here.  ROS: There have been no significant changes since the last visit here  EXAM: General: The patient is alert, oriented, generally healty appearing, NAD. Mood and affect are normal.  Breasts:  Right breast shows central erythema but minimal thickening  Lymphatics: She has no axillary or supraclavicular adenopathy on either side.  Extremities: Full ROM of the surgical side with no lymphedema noted.    Impression: Doing well, with no evidence of recurrent cancer or new cancer  Plan: Will continue to follow up in six months. She needs a mammogram in October. She is still deciding about reconstruction.

## 2012-06-16 ENCOUNTER — Encounter: Payer: Self-pay | Admitting: Radiation Oncology

## 2012-06-16 ENCOUNTER — Ambulatory Visit
Admission: RE | Admit: 2012-06-16 | Discharge: 2012-06-16 | Disposition: A | Discharge status: Home / Self Care | Payer: BC Managed Care – PPO | Source: Ambulatory Visit | Attending: Radiation Oncology | Admitting: Radiation Oncology

## 2012-06-16 VITALS — BP 122/84 | HR 88 | Temp 97.5°F | Wt 171.9 lb

## 2012-06-16 DIAGNOSIS — C50119 Malignant neoplasm of central portion of unspecified female breast: Secondary | ICD-10-CM

## 2012-06-16 NOTE — Progress Notes (Signed)
  Radiation Oncology         [phone] ________________________________  Name: Stephanie Malone MRN: [id number]  Date: 06/16/2012  DOB: 03-Apr-1956  Follow-Up Visit Note  CC: [name], MD  Streck, Reola Mosher, MD  Diagnosis:   Stage I right breast  Interval Since Last Radiation:  7 months  Narrative:  The patient returns today for routine follow-up.  She seems to be doing well at this time.  She is on tamoxifen and does have some hot flashes. She was placed on clonidine by Dr. Nicholas Lose which is helped somewhat.   She is scheduled for right nipple reconstruction in the near future. This is being performed by Dr. Kelly Splinter.  She denies any pain in the right breast area. she denies any problems with swelling in her right arm or hand.     She is having a lot of low back pain related degenerative arthritis.  She is being considered for an injection in the near future.                     ALLERGIES:   has no known allergies.  Meds: Current Outpatient Prescriptions  Medication Sig Dispense Refill  . ALPRAZolam (XANAX) 0.5 MG tablet Take 0.5 mg by mouth at bedtime as needed.        Marland Kitchen ibuprofen (ADVIL,MOTRIN) 200 MG tablet Take 800 mg by mouth every 6 (six) hours as needed.      . promethazine (PHENERGAN) 25 MG tablet Take 25 mg by mouth every 6 (six) hours as needed.        . SUMAtriptan (IMITREX) 100 MG tablet Take 100 mg by mouth as needed.        . tamoxifen (NOLVADEX) 20 MG tablet Take 1 tablet (20 mg total) by mouth daily. Started 2nd march, 2013,  90 tablet  12  . traZODone (DESYREL) 100 MG tablet Take 100 mg by mouth at bedtime.        Marland Kitchen esomeprazole (NEXIUM) 40 MG packet Take 40 mg by mouth daily before breakfast.  30 each  1  . naproxen sodium (ANAPROX) 220 MG tablet Take 220 mg by mouth as needed.         Physical Findings: The patient is in no acute distress. Patient is alert and oriented.  weight is 171 lb 14.4 oz (77.973 kg). Her temperature is 97.5 F (36.4 C). Her blood  pressure is 122/84 and her pulse is 88. Marland Kitchen  No palpable supraclavicular or axillary adenopathy. Lungs are clear to auscultation. The heart has a regular rhythm and rate.  Examination of the left breast reveals no mass or nipple discharge.  examination of the right breast area reveals the nipple areolar complex area to be surgically absent. There is no palpable mass within the right breast area. The patient's skin is well healed at this time.  Lab Findings: Lab Results  Component Value Date   WBC 6.0 01/28/2012   HGB 14.1 01/28/2012   HCT 40.4 01/28/2012   MCV 87.7 01/28/2012   PLT 265 01/28/2012    @LASTCHEM @  Radiographic Findings: No results found.  Impression:  The patient is recovering from the effects of radiation.  No evidence of recurrence on exam today.  Plan:  When necessary followup.  Patient will continue close followup with Dr. Jamey Ripa and Dr. Darnelle Catalan.  _____________________________________    Billie Lade, PhD, MD

## 2012-06-16 NOTE — Progress Notes (Signed)
Here for routine follow up right breast  Cancer radiation completed on March 1,2013.Scheduled for reconstructive surgery end of November 2013.Having increased back pain.Due for mammogram, [facility].

## 2012-07-08 ENCOUNTER — Other Ambulatory Visit: Payer: Self-pay | Admitting: Gynecology

## 2012-07-08 DIAGNOSIS — Z853 Personal history of malignant neoplasm of breast: Secondary | ICD-10-CM

## 2012-07-18 ENCOUNTER — Ambulatory Visit
Admission: RE | Admit: 2012-07-18 | Discharge: 2012-07-18 | Disposition: A | Discharge status: Home / Self Care | Payer: BC Managed Care – PPO | Source: Ambulatory Visit | Attending: Gynecology | Admitting: Gynecology

## 2012-07-18 DIAGNOSIS — Z853 Personal history of malignant neoplasm of breast: Secondary | ICD-10-CM

## 2012-08-26 ENCOUNTER — Ambulatory Visit (INDEPENDENT_AMBULATORY_CARE_PROVIDER_SITE_OTHER): Payer: BC Managed Care – PPO | Admitting: Family Medicine

## 2012-08-26 ENCOUNTER — Ambulatory Visit: Payer: BC Managed Care – PPO | Admitting: Family Medicine

## 2012-08-26 VITALS — BP 120/70 | Ht 65.0 in | Wt 164.0 lb

## 2012-08-26 DIAGNOSIS — M25559 Pain in unspecified hip: Secondary | ICD-10-CM

## 2012-08-26 DIAGNOSIS — M25551 Pain in right hip: Secondary | ICD-10-CM

## 2012-08-26 NOTE — Patient Instructions (Signed)
Very good to see you I am sorry to hear about your father Keep working on the hip strength this will be the best thing for you Start the other exercises I gave you.  Try ibuprofen 600mg  three times a day for 3 days to decrease the inflammation.  I want you to try these changes and then come back again in 4 weeks to make sure you are doing better.

## 2012-08-26 NOTE — Assessment & Plan Note (Signed)
Patient does have what appears to be an IT band syndrome that is proximal and a piriformis syndrome. Patient also has a leg length discrepancy that his right shoulder than the left. I think these are all contributing to her hip pain. Patient has no signs or findings that would make this intra-articular. Patient given exercises and we'll do a home first of anti-inflammatories. Patient will try these different interventions as well as a heel lift in her right shoe. At the end of this patient will return in 4 weeks for further evaluation. If patient continues to have pain will consider a corticosteroid injection into the greater trochanteric bursitis as well as consider ultrasound.

## 2012-08-26 NOTE — Progress Notes (Signed)
Subjective: Patient is a very pleasant 56 year old female who is coming in with right back and hip pain. Patient has a significant past medical history for breast cancer which she has had radiation and partial mastectomy.  Patient has also seen Dr. Bonnita Nasuti previously and did have back x-rays as well as epidural injection which did help some of the back pain but the hip pain persists. Patient describes the hip pain has lateral with no true groin pain. Patient states that the pain seems to radiate down the lateral aspect of her leg. Patient states that sometimes tender to palpation over the lateral aspect as well as in the proximal region. Patient denies any weakness or numbness. Patient denies any knee pain. Patient denies any fevers chills or any abnormal weight loss recently. Patient is able to do her activities of daily living but sometimes the pain can wake her up at night. Patient has tried taking ibuprofen as well as naproxen with some benefit. Patient is not taking any medicines now.  Past Medical History  Diagnosis Date  . Breast cancer, IDC, Right, ER+,PR+, HER2- 07/10/2011  . Migraine   . Degenerative lumbar disc   . Anxiety   . Chronic back pain   . Asthma    Past Surgical History  Procedure Date  . Abdominal hysterectomy     and rectocele repair  . Bladder suspension   . Endometrial ablation   . Mastectomy partial / lumpectomy [id number]    right central with SLN - Dr Jamey Ripa, right  . Breast lumpectomy 08/15/2011    Procedure: BREAST LUMPECTOMY WITH EXCISION OF SENTINEL NODE;  Surgeon: Currie Paris, MD;  Location: [facility];  Service: General;  Laterality: Right;   Family History  Problem Relation Age of Onset  . Liver cancer Father   . Pancreatic cancer Paternal Uncle   . Multiple myeloma Paternal Grandmother   . Stroke Mother   . Cirrhosis Father   . Colon polyps Father   . Heart disease Mother   . Diabetes Mother   . Diabetes Father    History   Substance Use Topics  . Smoking status: Former Smoker -- 0.3 packs/day for 12 years    Types: Cigarettes    Quit date: 08/27/1993  . Smokeless tobacco: Never Used  . Alcohol Use: Yes     Comment: wine cooler once evey 2-3 months   Current Outpatient Prescriptions on File Prior to Visit  Medication Sig Dispense Refill  . ALPRAZolam (XANAX) 0.5 MG tablet Take 0.5 mg by mouth at bedtime as needed.        Marland Kitchen esomeprazole (NEXIUM) 40 MG packet Take 40 mg by mouth daily before breakfast.  30 each  1  . ibuprofen (ADVIL,MOTRIN) 200 MG tablet Take 800 mg by mouth every 6 (six) hours as needed.      . naproxen sodium (ANAPROX) 220 MG tablet Take 220 mg by mouth as needed.       . promethazine (PHENERGAN) 25 MG tablet Take 25 mg by mouth every 6 (six) hours as needed.        . SUMAtriptan (IMITREX) 100 MG tablet Take 100 mg by mouth as needed.        . tamoxifen (NOLVADEX) 20 MG tablet Take 1 tablet (20 mg total) by mouth daily. Started 2nd march, 2013,  90 tablet  12  . traZODone (DESYREL) 100 MG tablet Take 100 mg by mouth at bedtime.  Physical exam Blood pressure 120/70, height 5\' 5"  (1.651 m), weight 164 lb (74.39 kg). General: No apparent distress alert and oriented x3 mood and affect somewhat normal but blunted. Respiratory: Patient's recent full sentences and does not appear short of breath Skin: Warm dry intact with no signs of infection or rash Neuro: Cranial nerves II through XII are intact, neurovascularly intact in all extremities with 2+ DTRs and 2+ pulses. ZOX:WRUEA ROM IR: 40 Deg, ER: 80 Deg, Flexion: 120 Deg, Extension: 100 Deg, Abduction: 45 Deg, Adduction: 45 Deg Strength IR: 5/5, ER: 4/5, Flexion: 5/5, Extension: 4/5, Abduction: 3/5, Adduction: 5/5 Pelvic alignment unremarkable to inspection and palpation. Standing hip rotation and gait without trendelenburg / unsteadiness. Greater trochanter with minimal tenderness but does have pain over TFL and IT band.  Mild  TTP over piriformis and greater trochanter. No SI joint tenderness and normal minimal SI movement. Neurovascularly intact distally and 2+ DTRs distally.

## 2012-09-30 ENCOUNTER — Ambulatory Visit: Payer: BC Managed Care – PPO | Admitting: Family Medicine

## 2012-10-14 ENCOUNTER — Ambulatory Visit: Payer: BC Managed Care – PPO | Admitting: Family Medicine

## 2012-11-14 ENCOUNTER — Other Ambulatory Visit: Payer: Self-pay | Admitting: Dermatology

## 2012-11-20 ENCOUNTER — Other Ambulatory Visit: Payer: Self-pay | Admitting: Dermatology

## 2012-11-25 ENCOUNTER — Ambulatory Visit (INDEPENDENT_AMBULATORY_CARE_PROVIDER_SITE_OTHER): Payer: BC Managed Care – PPO | Admitting: Surgery

## 2012-11-28 DIAGNOSIS — Z85820 Personal history of malignant melanoma of skin: Secondary | ICD-10-CM | POA: Insufficient documentation

## 2012-12-30 ENCOUNTER — Ambulatory Visit (INDEPENDENT_AMBULATORY_CARE_PROVIDER_SITE_OTHER): Payer: BC Managed Care – PPO | Admitting: Surgery

## 2012-12-30 ENCOUNTER — Encounter (INDEPENDENT_AMBULATORY_CARE_PROVIDER_SITE_OTHER): Payer: Self-pay | Admitting: Surgery

## 2012-12-30 VITALS — BP 128/76 | HR 64 | Temp 98.6°F | Resp 18 | Ht 66.0 in | Wt 168.0 lb

## 2012-12-30 DIAGNOSIS — Z853 Personal history of malignant neoplasm of breast: Secondary | ICD-10-CM

## 2012-12-30 DIAGNOSIS — Z85820 Personal history of malignant melanoma of skin: Secondary | ICD-10-CM

## 2012-12-30 NOTE — Progress Notes (Signed)
NAME: [name]       DOB: 01/05/1956           DATE: 12/30/2012       MRN: [id number]   Stephanie Malone is a 57 y.o.Marland Kitchenfemale who presents for routine followup of her Stage I, receptor +, Her 2  Neg Right breast cancer diagnosed in 2012 and treated with partial central mastectomy, radiation,now on Tamoxifen. She has no problems or concerns on either side.She apparently can not have a nipple reconstruction due to the radiation  PFSH: She has had no significant changes since the last visit here, except for removal of a melanoma of the right upper back  ROS: There have been no significant changes since the last visit here  EXAM: General: The patient is alert, oriented, generally healty appearing, NAD. Mood and affect are normal.  Breasts:  Right breast shows central erythema but minimal thickening  Lymphatics: She has no axillary or supraclavicular adenopathy on either side.  Extremities: Full ROM of the surgical side with no lymphedema noted.  Data Reviewed: Mammogram in October, 2013 RECOMMENDATION:  Diagnostic mammogram in 1 year is recommended.  I have discussed the findings and recommendations with the patient.  Results were also provided in writing at the conclusion of the  visit.  BI-RADS CATEGORY 2: Benign finding(s).  Original Report Authenticated By: Baird Lyons, M.D.     Impression: Doing well, with no evidence of recurrent cancer or new cancer  Plan: Will continue to follow up in six months. She needs a mammogram in October. She is still deciding about reconstruction.

## 2012-12-30 NOTE — Patient Instructions (Addendum)
See me again in November, after your mammogram is done

## 2013-01-26 ENCOUNTER — Other Ambulatory Visit: Payer: Self-pay | Admitting: *Deleted

## 2013-01-26 DIAGNOSIS — C50119 Malignant neoplasm of central portion of unspecified female breast: Secondary | ICD-10-CM

## 2013-01-27 ENCOUNTER — Other Ambulatory Visit (HOSPITAL_BASED_OUTPATIENT_CLINIC_OR_DEPARTMENT_OTHER): Payer: BC Managed Care – PPO | Admitting: Lab

## 2013-01-27 DIAGNOSIS — C50119 Malignant neoplasm of central portion of unspecified female breast: Secondary | ICD-10-CM

## 2013-01-27 LAB — COMPREHENSIVE METABOLIC PANEL (CC13)
Alkaline Phosphatase: 73 U/L (ref 40–150)
BUN: 11.1 mg/dL (ref 7.0–26.0)
Creatinine: 0.8 mg/dL (ref 0.6–1.1)
Glucose: 127 mg/dl — ABNORMAL HIGH (ref 70–99)
Sodium: 142 mEq/L (ref 136–145)
Total Bilirubin: 0.38 mg/dL (ref 0.20–1.20)

## 2013-01-27 LAB — CBC WITH DIFFERENTIAL/PLATELET
Eosinophils Absolute: 0.2 10*3/uL (ref 0.0–0.5)
HCT: 39.4 % (ref 34.8–46.6)
LYMPH%: 24 % (ref 14.0–49.7)
MCV: 88.7 fL (ref 79.5–101.0)
MONO%: 5.8 % (ref 0.0–14.0)
NEUT#: 3.5 10*3/uL (ref 1.5–6.5)
NEUT%: 66.3 % (ref 38.4–76.8)
Platelets: 267 10*3/uL (ref 145–400)
RBC: 4.44 10*6/uL (ref 3.70–5.45)

## 2013-02-03 ENCOUNTER — Ambulatory Visit (HOSPITAL_BASED_OUTPATIENT_CLINIC_OR_DEPARTMENT_OTHER): Payer: BC Managed Care – PPO | Admitting: Oncology

## 2013-02-03 ENCOUNTER — Telehealth: Payer: Self-pay | Admitting: *Deleted

## 2013-02-03 VITALS — BP 119/77 | HR 87 | Temp 98.1°F | Resp 20 | Wt 169.5 lb

## 2013-02-03 DIAGNOSIS — Z170 Estrogen receptor positive status [ER+]: Secondary | ICD-10-CM

## 2013-02-03 DIAGNOSIS — C50911 Malignant neoplasm of unspecified site of right female breast: Secondary | ICD-10-CM

## 2013-02-03 DIAGNOSIS — C50119 Malignant neoplasm of central portion of unspecified female breast: Secondary | ICD-10-CM

## 2013-02-03 MED ORDER — TAMOXIFEN CITRATE 20 MG PO TABS: 20.0000 mg | ORAL_TABLET | Freq: Every day | ORAL | Status: DC

## 2013-02-03 MED ORDER — VENLAFAXINE HCL ER 37.5 MG PO CP24: 37.5000 mg | ORAL_CAPSULE | Freq: Every day | ORAL | Status: DC

## 2013-02-03 NOTE — Telephone Encounter (Signed)
appts made and printed. Pt is aware that she is on Dr. Jamey Ripa call list for Nov.12, 2014...td

## 2013-02-03 NOTE — Progress Notes (Signed)
[id number]   CSN: [id number]  Is as and and CC:   Stephanie Malone, M.D. Currie Paris, M.D. Billie Lade, Ph.D., M.D. Stoney Bang, MD Larina Earthly, M.D.  HPI:  The patient had an unremarkable screening mammogram in June of 2011 at the [facility].  However, she noted what looked to her like nipple inversion.  She brought this to Dr. Vicente Males attention and he set her up for diagnostic mammography at the [facility], July 09, 2011.  This showed a heterogeneously dense breast pattern and new mild inversion of the right nipple.  There was also subtle distortion in the lateral subareolar portion of the right breast. Mammography also showed a small nodule medially within the right breast which was an interval change.  The left breast was unremarkable.  On physical exam, Dr. Jean Rosenthal noted the nipple inversion but no palpable mass and no palpable right axillary adenopathy.  Ultrasonography showed an irregularly marginated hypoechoic mass within the subareolar portion of the right breast at the 9 o'clock position measuring 1.3 cm.  There was also an intramammary lymph node with asymmetrical cortical thickening at the 4 o'clock position in the right breast.  This measured 6 mm.  The ultrasound of the axilla was unremarkable.  With this information, ultrasound-guided core biopsy of the right breast mass was performed.  On the initial pass, the mass collapsed consistent with a complicated cyst.  The pathology from this procedure ([id number]) showed an invasive mammary carcinoma, grade 1 which was strongly estrogen and progesterone receptor positive at 99% and 100% respectively.  The MIB-1 was 3%.  There was no amplification of HER-2 by CISH, the ratio being 1.43.  Bilateral breast MRIs were obtained July 13, 2011.  This again showed the right lateral sub areolar mass measuring 1.5 cm.  The other area of the right breast, which had also been previously biopsied, had  been shown to be benign. The patient proceeded to definitive Right lumpectomy and sentinel lymph node sampling August 15, 2011. Her subsequent history is as detailed below.  Interval history: Ciclaly returns today for followup of her breast cancer. The interval history is significant for her father having died from liver cancer secondary to cryptogenic cirrhosis December of 2013. The patient is working with her brother to sell the house and other belongs. Aside from grief the process is going well.  ROS: She is tolerating tamoxifen without unusual side effects. She does have hot flashes, which are more bothersome now but it's a little bit warmer. Sometimes her feet cramp a little bit she just walks that off. She has back pain which is inconstant and not more frequent or intense than prior. She has a history of migraines. Otherwise a detailed review of systems today was noncontributory.  PAST MEDICAL HISTORY:  Significant for migraines and degenerative disk disease.  She is status post pelvic floor repair and Burch procedure in 1997 and hysterectomy with further pelvic floor repair in May of 2012. This was a simple hysterectomy with no salpingo-oophorectomy and benign pathology ([id number] [phone]).  FAMILY HISTORY:  The patient's father died at age 67 with hepatocellular carcinoma secondary to cryptogenic cirrhosis.  The patient's mother died at age 72.  [place] has 1 brother, no sisters.  There is no history of breast or ovarian cancer in the family.   GYNECOLOGIC HISTORY:  She had menarche age 81.  Status post simple hysterectomy in 2012 She is GX, P3, 1st pregnancy to term at age  33, which she understands is a risk factor for breast cancer.  The patient used oral contraceptives for approximately 5 years remotely.  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  Her husband goes by "[name]".  Their sons are [name], who lives in [place] and is a Consulting civil engineer; [name], who attends [employer] as a Consulting civil engineer; and [name] who is at home where she, of course, goes to high school.  HEALTH MAINTENANCE:  The patient smoked minimally, perhaps as much as 5- pack-years.  She quit in 1995.  She does not use alcohol.  She had a colonoscopy in October of 2011, a bone density in 2010 which was normal (there is a copy in the chart), most recent Pap smear September of 2011.  ALLERGIES:  No known drug allergies.  MEDICATIONS:   Current outpatient prescriptions:ALPRAZolam (XANAX) 0.5 MG tablet, Take 0.5 mg by mouth at bedtime as needed.  , Disp: , Rfl: ;  esomeprazole (NEXIUM) 40 MG packet, Take 40 mg by mouth daily before breakfast., Disp: 30 each, Rfl: 1;  ibuprofen (ADVIL,MOTRIN) 200 MG tablet, Take 800 mg by mouth every 6 (six) hours as needed., Disp: , Rfl: ;  naproxen sodium (ANAPROX) 220 MG tablet, Take 220 mg by mouth as needed. , Disp: , Rfl:  promethazine (PHENERGAN) 25 MG tablet, Take 25 mg by mouth every 6 (six) hours as needed.  , Disp: , Rfl: ;  SUMAtriptan (IMITREX) 100 MG tablet, Take 100 mg by mouth as needed.  , Disp: , Rfl: ;  tamoxifen (NOLVADEX) 20 MG tablet, Take 1 tablet (20 mg total) by mouth daily. Started 2nd march, 2013,, Disp: 90 tablet, Rfl: 12;  traZODone (DESYREL) 100 MG tablet, Take 100 mg by mouth at bedtime.  , Disp: , Rfl:  venlafaxine XR (EFFEXOR-XR) 37.5 MG 24 hr capsule, Take 1 capsule (37.5 mg total) by mouth daily., Disp: 90 capsule, Rfl: 12  PHYSICAL EXAM:  middle-aged white woman in no acute distress Filed Vitals:   02/03/13 1035  BP: 119/77  Pulse: 87  Temp: 98.1 F (36.7 C)  Resp: 20  HEENT: Sclerae are not icteric.  Oropharynx is clear.  I do not palpate any peripheral adenopathy including in the right neck or axilla.  Lungs:  No crackles or wheezes. Heart:  Regular rate and rhythm.  No murmur appreciated. Breasts:  Right breast is status post central lumpectomy. The nipple has not yet been tatoo'd. There is no evidence of local recurrence. The left  breast is unremarkable. Abdomen:  Benign. Musculoskeletal:  No focal spinal tenderness.  No peripheral edema. Neurologic:  Nonfocal. Well oriented. Pleasant affect  LAB WORK:   Results for CENIYAH, THORP (MRN [id number]) as of 02/03/2013 12:49  Ref. Range 01/27/2013 10:32  Sodium Latest Range: 136-145 mEq/L 142  Potassium Latest Range: 3.5-5.1 mEq/L 4.4  Chloride Latest Range: 96-112 mEq/L 109 (H)  CO2 Latest Range: 19-32 mEq/L 25  BUN Latest Range: 7.0-26.0 mg/dL 65.7  Creatinine Latest Range: 0.50-1.10 mg/dL 0.8  Calcium Latest Range: 8.4-10.5 mg/dL 8.3 (L)  Glucose Latest Range: 70-99 mg/dl 846 (H)  Alkaline Phosphatase Latest Range: 39-117 U/L 73  Albumin Latest Range: 3.5-5.2 g/dL 3.3 (L)  AST Latest Range: 0-37 U/L 20  ALT Latest Range: 0-35 U/L 15  Total Protein Latest Range: 6.4-8.3 g/dL 6.6  Total Bilirubin Latest Range: 0.3-1.2 mg/dL 9.62  WBC Latest Range: 4.0-10.5 K/uL 5.2  RBC Latest Range: 3.87-5.11 MIL/uL 4.44  Hemoglobin Latest Range:  12.0-15.0 g/dL 16.1  HCT Latest Range: 36.0-46.0 % 39.4  MCV Latest Range: 78.0-100.0 fL 88.7  MCH Latest Range: 26.0-34.0 pg 30.9  MCHC Latest Range: 30.0-36.0 g/dL 09.6  RDW Latest Range: 11.5-15.5 % 12.7  Platelets Latest Range: 150-400 K/uL 267  NEUT% Latest Range: 38.4-76.8 % 66.3  LYMPH% Latest Range: 14.0-49.7 % 24.0  MONO% Latest Range: 0.0-14.0 % 5.8  EOS% Latest Range: 0.0-7.0 % 3.1  BASO% Latest Range: 0.0-2.0 % 0.8  NEUT# Latest Range: 1.7-7.7 K/uL 3.5  MONO# Latest Range: 0.1-0.9 10e3/uL 0.3  Eosinophils Absolute Latest Range: 0.0-0.5 10e3/uL 0.2  Basophils Absolute Latest Range: 0.0-0.1 K/uL 0.0  lymph# Latest Range: 0.9-3.3 10e3/uL 1.3   FILMS: No results found. Next mammogram will be due November of 2014   IMPRESSION:  57 y.o.  [place] woman status post right central lumpectomy November 2012 for a T1c N0 (Stage IA) invasive ductal carcinoma, grade 1, strongly estrogen and progesterone receptor positive, with  no HER-2 amplification, and an MIB-1 of 3%;   (1)the patient's ONCOTYPE DX reports a recurrence score of 7, predicting a risk of distant recurrence of 6% if her only systemic treatment is tamoxifen for 5 years;   (2)she completed radiation March 2013 at which point she started tamoxifen.  (3)status post simple hysterectomy in 2012  PLAN: Kataleena is tolerating tamoxifen well and the plan is going to be to continue this medication for 10 years: She does not like the idea of going on aromatase inhibitors, and the fact that she status post hysterectomy also makes tamoxifen more attractive. This means she may continue to use Vagifem suppositories at her discretion.  If she sees her surgeon, Dr. Jamey Ripa, this November, she will see [place] again in one year and we will continue to see her here in a yearly basis until she completes her planned treatment. Today I added venlafaxine it are 37.5 mg to see if we can improve on the hot flashes problem. She will let us know if that is not effective.

## 2013-04-20 ENCOUNTER — Ambulatory Visit (INDEPENDENT_AMBULATORY_CARE_PROVIDER_SITE_OTHER): Payer: BC Managed Care – PPO | Admitting: Sports Medicine

## 2013-04-20 VITALS — BP 120/70 | Ht 66.0 in | Wt 170.0 lb

## 2013-04-20 DIAGNOSIS — M79662 Pain in left lower leg: Secondary | ICD-10-CM

## 2013-04-20 DIAGNOSIS — M79609 Pain in unspecified limb: Secondary | ICD-10-CM

## 2013-04-20 NOTE — Progress Notes (Signed)
  Subjective:    Patient ID: Stephanie Malone, female    DOB: 1955/09/25, 57 y.o.   MRN: [id number]  HPI Patient is a 57 yo female presenting today after sustaining an injury to the left lower leg during an exercise class this morning (04/20/13). Patient states she was running on the asphalt for her class when she felt a "pop" in her left calf, which caused her to immediately discontinue her workout. She has been able to walk after this injury (with a limp).  The pain she is experiencing is localized to the medial aspect of the posterior left lower leg. She describes this pain as sharp in nature. The pain does not radiate. Pain is absent at rest. Dorsiflexion of the left foot aggravates this pain. Patient took two (2) Aleve and has been using ice with elevation sense the injury which has helped. She denies any instability, swelling, ecchymoses, paresthesias, or weakness  PMH History of Breast CA (ER/PR+, HER2-) History of melanoma (upper right back) Sacroiliac joint pain, right hip pain Esophageal dysmotility Former Smoker (3.6pack-years)  Meds Xanax 0.5mg  Ibuprofen 200mg  Naproxen 220mg  Tamoxifen 20mg  Meds (long term) Phenergan 25mg  Imitrex 100mg  Trazodone 100mg  Effexor-XR 37.5mg   Review of Systems Denies fever, chills, cough, SOB, or other joint complaints.     Objective:   Physical Exam General: Patient is a very pleasant well developed, well nourished Caucasian female who appears of stated age. In no acute distress. Vitals: Reviewed Knee: Full ROM, muscle tone good, strength 5/5, no joint line tenderness, no effusion/ecchymoses noted, no crepitus bilaterally. Negative McMurray's/Lachman's/drawer tests bilaterally. Ankle: Full ROM, muscle tone good, strength 5/5, no joint tenderness, no effusion/ecchymoses bilaterally. Significant tenderness noted at the medial aspect of the left gastrocnemius/soleus muscles with palpation. Good plantar flexion with Thompson's  testing.  Ultrasound:  Ultrasound of the calf and Achilles tendon was performed. Both long and short images were obtained. Intact Achilles noted. Gastrocnemius was observed without abnormalities or pathology. Soleus with noted hypoechoic regions. I do not see any significant neovascularity. Findings significant for Soleus muscle strain.      Assessment & Plan:  1) Muscle strain - Soleus  - Bodyhelix calf compression sleeve  - RICE protocol  - Cautioned against aggressive stretching or exertion of Soleus             - 516 inch heel lifts bilaterally to help take some stretch off of the calf  - Follow-up: 3 weeks. Plan on repeat ultrasound at that visit.

## 2013-04-23 ENCOUNTER — Other Ambulatory Visit: Payer: Self-pay | Admitting: Oncology

## 2013-05-11 ENCOUNTER — Encounter: Payer: Self-pay | Admitting: Sports Medicine

## 2013-05-11 ENCOUNTER — Ambulatory Visit (INDEPENDENT_AMBULATORY_CARE_PROVIDER_SITE_OTHER): Payer: BC Managed Care – PPO | Admitting: Sports Medicine

## 2013-05-11 VITALS — BP 119/79 | HR 75 | Ht 66.0 in | Wt 170.0 lb

## 2013-05-11 DIAGNOSIS — M79662 Pain in left lower leg: Secondary | ICD-10-CM

## 2013-05-11 DIAGNOSIS — M79646 Pain in unspecified finger(s): Secondary | ICD-10-CM

## 2013-05-11 DIAGNOSIS — M79609 Pain in unspecified limb: Secondary | ICD-10-CM

## 2013-05-11 DIAGNOSIS — M79669 Pain in unspecified lower leg: Secondary | ICD-10-CM | POA: Insufficient documentation

## 2013-05-11 NOTE — Assessment & Plan Note (Addendum)
Soleus muscle tear on ultrasound shows good evidence of healing. She is to continue using the body helix brace. She is to start with a light walking program and will be able to progress to normal activity with the exception of explosive activity in one week if she tolerates this well.  She is to limit her activity to a pain level of 3-4/10.   >She'll return in 3 weeks for a repeat ultrasound

## 2013-05-11 NOTE — Progress Notes (Signed)
  [facility]  Patient name: Stephanie Malone MRN [id number]  Date of birth: 07/11/1956  CC & HPI:  Stephanie Malone is a 57 y.o. female who is returning to care.   Chief Complaint  Patient presents with  . Follow-up    left calf  Patient is here for three-week followup from a left soleus muscle tear.  She reports her pain has been improving.  She has been wearing a body helix.  She iced her leg significantly after her injury.  She does continue to ice on occasion.  She denies having any bruising or significant swelling following the injury.  She has been avoiding any strenuous activity and has not been walking on regular basis.  She does note that she still has some tightness in the calf and does not feel as though she is ready to return to full play at this time.     Patient also reports she has been having occasional bilateral thumb pain it seems to be worse.  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  Notices that her grip strength is poor and she has occasional thumb pain.  She has not tried anything for this. No prior wrist surgeries.   ROS:  See HPI  Pertinent History Reviewed:  Medical & Surgical Hx:  Reviewed: Significant for breast cancer, HER-2 negative.  Currently on tamoxifen.  History of melanoma.  History of degenerative joint disease in her back.  History of Madelung's deformity Medications: Reviewed & Updated - see associated section Social History: Reviewed -  reports that she quit smoking about 19 years ago. Her smoking use included Cigarettes. She has a 3.6 pack-year smoking history. She has never used smokeless tobacco.   Objective Findings:  Vitals: BP 119/79  Pulse 75  Ht 5\' 6"  (1.676 m)  Wt 170 lb (77.111 kg)  BMI 27.45 kg/m2  PE: GENERAL:  adult Caucasian female. In no discomfort; no respiratory distress  PSYCH:  alert and appropriate, good insight      NeuroMSK  left calf Exam: Appear:  normal the   Palp:  no specific  tenderness to palpation over the gastroc or soleus.  No significant defect appreciated.    ROM:  full ankle and knee range of motion   NV:   lower extremity sensation grossly intact   Testing:  plantar flexion strength 4/5 with a bent knee but no significant pain.     Bilateral wrist Exam: Appear:  with prominent deformity and bilateral wrists with prominent ulnar styloid.    Palp:  minimal tenderness to palpation   ROM:  significantly limited radial deviation , normal ulnar deviation.  Significant limited supination bilaterally to approximately 30 from neutral.    NV:   good capillary refill, sensation grossly   Testing:  bilateral negative Finkelstein's test     MSK [place]:   there remains a slight area of hypoechoic change in the proximal soleus muscle belly medial more significant than lateral; these hypoechoic areas are significantly lessened compared to last time which is consistent with improved healing.  There is what appears to be a prominent lymphatic vessel that is present in the gastroc.    Assessment & Plan:   1. Calf pain, left   2. Thumb pain, unspecified laterality    See problem associated charting

## 2013-05-12 NOTE — Assessment & Plan Note (Addendum)
Given history of Madelung's deformity high likelihood of pain secondary to this condition.  No evidence of significant disability and patient's symptoms do not significantly interfere with her life at this time.  Recommended to try anti-inflammatories as needed, cool soaks and/or ice.  If she has worsening symptoms we'll have her follow up to discuss further options and further workup at that time.

## 2013-06-03 ENCOUNTER — Ambulatory Visit (INDEPENDENT_AMBULATORY_CARE_PROVIDER_SITE_OTHER): Payer: BC Managed Care – PPO | Admitting: Sports Medicine

## 2013-06-03 VITALS — BP 114/64 | Ht 66.0 in | Wt 170.0 lb

## 2013-06-03 DIAGNOSIS — M79662 Pain in left lower leg: Secondary | ICD-10-CM

## 2013-06-03 DIAGNOSIS — M79609 Pain in unspecified limb: Secondary | ICD-10-CM

## 2013-06-03 NOTE — Progress Notes (Signed)
  Subjective:    Patient ID: Stephanie Malone, female    DOB: February 19, 1956, 56 y.o.   MRN: [id number]  HPI Stephanie Malone is a 57 year old female who is coming in to follow up for left calf pain. She had injured herself about 6 weeks ago while doing sprints, and was found to have a soleus strain. She was put on RICE protocol, given a helix compression sleeve, and heel lifts. She followed up 3 weeks ago at which time she still had some ultrasound findings of soleus strain. She was started back on minimal activity and kept on the compression sleeve. She stopped using the heel lifts because she didn't like them. Today, she reports that she is overall feeling much better. She does not have any pain in her calf, but does continue to feel a little tight. She also has been "cautiously nervous" about activity on her left leg, so is trying to limit how much she pushes off from that foot, and is now starting to feel some discomfort and tightness in the right calf/achilles region. She has been walking about 1.5 miles, and has gone back to exercise class where she focuses on upper body and static movements. She has not had any pain with exercise.    Review of Systems       Objective:   Physical Exam  Constitutional: She is oriented to person, place, and time. She appears well-developed and well-nourished.  HENT:  Head: Normocephalic and atraumatic.  Eyes: Conjunctivae are normal.  Pulmonary/Chest: Effort normal.  Neurological: She is alert and oriented to person, place, and time.  Psychiatric: She has a normal mood and affect. Her behavior is normal.   Bilateral lower legs: No tenderness to palpation. No pain in knee joints, ankles, or along achilles tendons. Normal ROM w/ knee flexion/extension and ankle dorsi/plantar flexion, inversion, and eversion. 5/5 strength bilaterally in knees and ankles. Negative Hoffman's sign bilaterally. Normal plantarflexion with Thompson's test.   Ultrasound of left calf shows normal  gastroc/soleus muscle fibers without any evidence of tear or inflammation. Achilles tendon intact. Right achilles tendon has minimal fluid with no tears or disruption of muscle fibers.      Assessment & Plan:  57 year old female here for f/u of left soleus muscle strain - Strain appears to have healed well - Patient will start gastroc/soleus stretches at home - She will slowly increase her activity back to normal for her - Given small amount of fluid around right achilles tendon, will give her some achilles stretches as well  - Continue to use body helix calf compression sleeve with exercise and up to 1 hour afterwards  F/U PRN  Stephanie Malone, PGY-3

## 2013-06-23 ENCOUNTER — Other Ambulatory Visit: Payer: Self-pay | Admitting: Oncology

## 2013-06-23 DIAGNOSIS — Z853 Personal history of malignant neoplasm of breast: Secondary | ICD-10-CM

## 2013-07-20 ENCOUNTER — Ambulatory Visit
Admission: RE | Admit: 2013-07-20 | Discharge: 2013-07-20 | Disposition: A | Discharge status: Home / Self Care | Payer: BC Managed Care – PPO | Source: Ambulatory Visit | Attending: Oncology | Admitting: Oncology

## 2013-07-20 DIAGNOSIS — Z853 Personal history of malignant neoplasm of breast: Secondary | ICD-10-CM

## 2013-07-23 ENCOUNTER — Other Ambulatory Visit: Payer: Self-pay

## 2013-08-07 ENCOUNTER — Encounter (INDEPENDENT_AMBULATORY_CARE_PROVIDER_SITE_OTHER): Payer: Self-pay | Admitting: Surgery

## 2013-08-07 ENCOUNTER — Ambulatory Visit (INDEPENDENT_AMBULATORY_CARE_PROVIDER_SITE_OTHER): Payer: BC Managed Care – PPO | Admitting: Surgery

## 2013-08-07 VITALS — BP 118/78 | HR 80 | Temp 97.2°F | Resp 16 | Ht 66.0 in | Wt 164.0 lb

## 2013-08-07 DIAGNOSIS — Z853 Personal history of malignant neoplasm of breast: Secondary | ICD-10-CM

## 2013-08-07 NOTE — Patient Instructions (Signed)
Continue annual mammograms and followups with [place]. Call if you have any problems or questions [phone]

## 2013-08-07 NOTE — Progress Notes (Signed)
NAME: Stephanie Malone       DOB: 1955/11/28           DATE: 08/07/2013       MRN: [id number]   Zarin A Traynor is a 57 y.o.Marland Kitchenfemale who presents for routine followup of her Stage I, receptor +, Her 2  Neg Right breast cancer diagnosed in 2012 and treated with partial central mastectomy, radiation,now on Tamoxifen. She has no problems or concerns on either side.She apparently can not have a nipple reconstruction due to the radiation  PFSH: She has had no significant changes since the last visit here.  ROS: There have been no significant changes since the last visit here  EXAM: General: The patient is alert, oriented, generally healty appearing, NAD. Mood and affect are normal.  Breasts:  Right breast shows central erythema but minimal thickening  Lymphatics: She has no axillary or supraclavicular adenopathy on either side.  Extremities: Full ROM of the surgical side with no lymphedema noted.  Data Reviewed: Mammogram in November, 2014 ACR Breast Density Category b: There are scattered areas of  fibroglandular density.  FINDINGS:  Postsurgical change central right breast stable. No suspicious  mammographic abnormalities on either side. No change from prior  studies.  IMPRESSION:  Benign postoperative change.  RECOMMENDATION:  Diagnostic bilateral mammogram in 1 year  BI-RADS CATEGORY 2: Benign Finding(s)  Electronically Signed  By: Esperanza Heir M.D.  On: 07/20/2013 12:02       Impression: Doing well, with no evidence of recurrent cancer or new cancer  Plan: She'll come back in one year followup after her next mammogram. She has still not made a decision about reconstruction.

## 2013-12-31 ENCOUNTER — Telehealth: Payer: Self-pay | Admitting: Physician Assistant

## 2013-12-31 NOTE — Telephone Encounter (Signed)
, °

## 2014-01-19 ENCOUNTER — Other Ambulatory Visit: Payer: Self-pay | Admitting: *Deleted

## 2014-01-19 DIAGNOSIS — C50119 Malignant neoplasm of central portion of unspecified female breast: Secondary | ICD-10-CM

## 2014-01-20 ENCOUNTER — Other Ambulatory Visit (HOSPITAL_BASED_OUTPATIENT_CLINIC_OR_DEPARTMENT_OTHER): Payer: BC Managed Care – PPO

## 2014-01-20 DIAGNOSIS — C50119 Malignant neoplasm of central portion of unspecified female breast: Secondary | ICD-10-CM

## 2014-01-20 LAB — COMPREHENSIVE METABOLIC PANEL (CC13)
ALT: 16 U/L (ref 0–55)
ANION GAP: 9 meq/L (ref 3–11)
AST: 20 U/L (ref 5–34)
Albumin: 3.4 g/dL — ABNORMAL LOW (ref 3.5–5.0)
Alkaline Phosphatase: 69 U/L (ref 40–150)
BUN: 11.5 mg/dL (ref 7.0–26.0)
CALCIUM: 9.1 mg/dL (ref 8.4–10.4)
CHLORIDE: 110 meq/L — AB (ref 98–109)
CO2: 25 meq/L (ref 22–29)
CREATININE: 0.8 mg/dL (ref 0.6–1.1)
Glucose: 121 mg/dl (ref 70–140)
Potassium: 4.7 mEq/L (ref 3.5–5.1)
Sodium: 144 mEq/L (ref 136–145)
Total Bilirubin: 0.41 mg/dL (ref 0.20–1.20)
Total Protein: 6.3 g/dL — ABNORMAL LOW (ref 6.4–8.3)

## 2014-01-20 LAB — CBC WITH DIFFERENTIAL/PLATELET
BASO%: 0.7 % (ref 0.0–2.0)
BASOS ABS: 0 10*3/uL (ref 0.0–0.1)
EOS%: 3 % (ref 0.0–7.0)
Eosinophils Absolute: 0.2 10*3/uL (ref 0.0–0.5)
HEMATOCRIT: 39.6 % (ref 34.8–46.6)
HEMOGLOBIN: 13.6 g/dL (ref 11.6–15.9)
LYMPH#: 1.2 10*3/uL (ref 0.9–3.3)
LYMPH%: 20.4 % (ref 14.0–49.7)
MCH: 30.6 pg (ref 25.1–34.0)
MCHC: 34.2 g/dL (ref 31.5–36.0)
MCV: 89.4 fL (ref 79.5–101.0)
MONO#: 0.3 10*3/uL (ref 0.1–0.9)
MONO%: 5.1 % (ref 0.0–14.0)
NEUT#: 4.2 10*3/uL (ref 1.5–6.5)
NEUT%: 70.8 % (ref 38.4–76.8)
Platelets: 283 10*3/uL (ref 145–400)
RBC: 4.43 10*6/uL (ref 3.70–5.45)
RDW: 12.7 % (ref 11.2–14.5)
WBC: 6 10*3/uL (ref 3.9–10.3)

## 2014-01-27 ENCOUNTER — Telehealth: Payer: Self-pay | Admitting: Oncology

## 2014-01-27 ENCOUNTER — Ambulatory Visit (HOSPITAL_BASED_OUTPATIENT_CLINIC_OR_DEPARTMENT_OTHER): Payer: BC Managed Care – PPO | Admitting: Hematology and Oncology

## 2014-01-27 ENCOUNTER — Other Ambulatory Visit: Payer: BC Managed Care – PPO

## 2014-01-27 VITALS — BP 122/75 | HR 69 | Temp 97.6°F | Resp 18 | Ht 66.0 in | Wt 156.9 lb

## 2014-01-27 DIAGNOSIS — C50919 Malignant neoplasm of unspecified site of unspecified female breast: Secondary | ICD-10-CM

## 2014-01-27 DIAGNOSIS — R61 Generalized hyperhidrosis: Secondary | ICD-10-CM

## 2014-01-27 DIAGNOSIS — C50119 Malignant neoplasm of central portion of unspecified female breast: Secondary | ICD-10-CM

## 2014-01-27 DIAGNOSIS — N899 Noninflammatory disorder of vagina, unspecified: Secondary | ICD-10-CM

## 2014-01-27 DIAGNOSIS — Z170 Estrogen receptor positive status [ER+]: Secondary | ICD-10-CM

## 2014-01-27 NOTE — Progress Notes (Signed)
[id number]   CSN: [id number]   CC:   Stephanie Malone, M.D. Stephanie Malone, M.D. Stephanie Malone, Ph.D., M.D. Stephanie Mering, MD Stephanie Malone, M.D.  Chief complaint: Follow up visit for breast cancer  History of present illness:  As per the previously documented note:  The patient had an unremarkable screening mammogram in June of 2011 at the [facility].  However, she noted what looked to her like nipple inversion.  She brought this to Dr. Danna Hefty attention and he set her up for diagnostic mammography at the [facility], July 09, 2011.  This showed a heterogeneously dense breast pattern and new mild inversion of the right nipple.  There was also subtle distortion in the lateral subareolar portion of the right breast. Mammography also showed a small nodule medially within the right breast which was an interval change.  The left breast was unremarkable.  On physical exam, Dr. Glennon Mac noted the nipple inversion but no palpable mass and no palpable right axillary adenopathy.  Ultrasonography showed an irregularly marginated hypoechoic mass within the subareolar portion of the right breast at the 9 o'clock position measuring 1.3 cm.  There was also an intramammary lymph node with asymmetrical cortical thickening at the 4 o'clock position in the right breast.  This measured 6 mm.  The ultrasound of the axilla was unremarkable.  With this information, ultrasound-guided core biopsy of the right breast mass was performed.  On the initial pass, the mass collapsed consistent with a complicated cyst.  The pathology from this procedure ([id number]) showed an invasive mammary carcinoma, grade 1 which was strongly estrogen and progesterone receptor positive at 99% and 100% respectively.  The MIB-1 was 3%.  There was no amplification of HER-2 by CISH, the ratio being 1.43.  Bilateral breast MRIs were obtained July 13, 2011.  This again showed the right lateral sub areolar  mass measuring 1.5 cm.  The other area of the right breast, which had also been previously biopsied, had been shown to be benign. The patient proceeded to definitive Right lumpectomy and sentinel lymph node sampling August 15, 2011. Her subsequent history is as detailed below.  Interval history:  Stephanie Malone returns today for followup visit for her breast cancer. She says she was started on Effexor for severe hot flashes during the last visit. On Effexor she developed drenching sweats and decrease in libido and she stopped taking Effexor.  She still continued to have hot flashes and also minimal vaginal dryness. She says she is able to tolerate okay. She did not use vagifem for her vaginal dryness, in view of high deductible insurance. She denies any shortness of breath, chest pain, palpitations, headaches, blurred vision, involuntary weight loss, or decrease in appetite.  Review of systems:  A 10 point review of systems is been assessed and pertinent symptoms are mentioned in interval history  PAST MEDICAL HISTORY:  Significant for migraines and degenerative disk disease.  She is status post pelvic floor repair and Burch procedure in 1997 and hysterectomy with further pelvic floor repair in May of 2012. This was a simple hysterectomy with no salpingo-oophorectomy and benign pathology ([phone]).  FAMILY HISTORY:  The patient's father died at age 24 with hepatocellular carcinoma secondary to cryptogenic cirrhosis.  The patient's mother died at age 95.  Stephanie Malone has 1 brother, no sisters.  There is no history of breast or ovarian cancer in the family.   GYNECOLOGIC HISTORY:  She had menarche age 12.  Status  post simple hysterectomy in 2012 She is GX, P3, 1st pregnancy to term at age 66, which she understands is a risk factor for breast cancer.  The patient used oral contraceptives for approximately 5 years remotely.  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  Her  husband goes by "Stephanie Malone".  Their sons are Stephanie Malone, who lives in [place] and is a Ship broker; Stephanie Malone, who attends [employer] as a Ship broker; and Stephanie Malone who is at home where she, of course, goes to high school.  HEALTH MAINTENANCE:  The patient smoked minimally, perhaps as much as 5- pack-years.  She quit in 1995.  She does not use alcohol.  She had a colonoscopy in October of 2011, a bone density in 2010 which was normal (there is a copy in the chart), most recent Pap smear September of 2011.  ALLERGIES:  No known drug allergies.  MEDICATIONS:   Current outpatient prescriptions:ALPRAZolam (XANAX) 0.5 MG tablet, Take 0.5 mg by mouth at bedtime as needed.  , Disp: , Rfl: ;  ibuprofen (ADVIL,MOTRIN) 200 MG tablet, Take 800 mg by mouth every 6 (six) hours as needed., Disp: , Rfl: ;  naproxen sodium (ANAPROX) 220 MG tablet, Take 220 mg by mouth as needed. , Disp: , Rfl: ;  Omega-3 Fatty Acids (FISH OIL) 1000 MG CAPS, Take by mouth., Disp: , Rfl:  SUMAtriptan (IMITREX) 100 MG tablet, Take 100 mg by mouth as needed.  , Disp: , Rfl: ;  tamoxifen (NOLVADEX) 20 MG tablet, Take 1 tablet (20 mg total) by mouth daily. Started 2nd march, 2013,, Disp: 90 tablet, Rfl: 12;  traZODone (DESYREL) 100 MG tablet, Take 100 mg by mouth at bedtime.  , Disp: , Rfl: ;  Turmeric 500 MG CAPS, Take by mouth 2 (two) times daily., Disp: , Rfl:  esomeprazole (NEXIUM) 40 MG packet, Take 40 mg by mouth daily before breakfast., Disp: 30 each, Rfl: 1  PHYSICAL EXAM:  middle-aged white woman in no acute distress Filed Vitals:   01/27/14 1020  BP: 122/75  Pulse: 69  Temp: 97.6 F (36.4 C)  Resp: 18  HEENT: Sclerae are not icteric.  Oropharynx is clear.  I do not palpate any peripheral adenopathy including in the right neck or axilla.  Lungs:  No crackles or wheezes. Heart:  Regular rate and rhythm.  No murmur appreciated. Breasts:  Right breast is status post central lumpectomy. The nipple has not yet been tatoo'd. There is no evidence of  local recurrence. The left breast is unremarkable. No bilateral axillary lymphadenopathy Abdomen:  Benign. Musculoskeletal:  No focal spinal tenderness.  No peripheral edema. Neurologic:  Nonfocal. Well oriented. Pleasant affect Extremities: No edema no cyanosis, no clubbing Skin: Intact  LAB WORK:   Results for MARIANA, WIEDERHOLT (MRN [id number]) as of 02/03/2013 12:49  Ref. Range 01/27/2013 10:32  Sodium Latest Range: 136-145 mEq/L 142  Potassium Latest Range: 3.5-5.1 mEq/L 4.4  Chloride Latest Range: 96-112 mEq/L 109 (H)  CO2 Latest Range: 19-32 mEq/L 25  BUN Latest Range: 7.0-26.0 mg/dL 11.1  Creatinine Latest Range: 0.50-1.10 mg/dL 0.8  Calcium Latest Range: 8.4-10.5 mg/dL 8.3 (L)  Glucose Latest Range: 70-99 mg/dl 127 (H)  Alkaline Phosphatase Latest Range: 39-117 U/L 73  Albumin Latest Range: 3.5-5.2 g/dL 3.3 (L)  AST Latest Range: 0-37 U/L 20  ALT Latest Range: 0-35 U/L 15  Total Protein Latest Range: 6.4-8.3 g/dL 6.6  Total Bilirubin Latest Range: 0.3-1.2 mg/dL 0.38  WBC Latest Range:  4.0-10.5 K/uL 5.2  RBC Latest Range: 3.87-5.11 MIL/uL 4.44  Hemoglobin Latest Range: 12.0-15.0 g/dL 13.7  HCT Latest Range: 36.0-46.0 % 39.4  MCV Latest Range: 78.0-100.0 fL 88.7  MCH Latest Range: 26.0-34.0 pg 30.9  MCHC Latest Range: 30.0-36.0 g/dL 34.9  RDW Latest Range: 11.5-15.5 % 12.7  Platelets Latest Range: 150-400 K/uL 267  NEUT% Latest Range: 38.4-76.8 % 66.3  LYMPH% Latest Range: 14.0-49.7 % 24.0  MONO% Latest Range: 0.0-14.0 % 5.8  EOS% Latest Range: 0.0-7.0 % 3.1  BASO% Latest Range: 0.0-2.0 % 0.8  NEUT# Latest Range: 1.7-7.7 K/uL 3.5  MONO# Latest Range: 0.1-0.9 10e3/uL 0.3  Eosinophils Absolute Latest Range: 0.0-0.5 10e3/uL 0.2  Basophils Absolute Latest Range: 0.0-0.1 K/uL 0.0  lymph# Latest Range: 0.9-3.3 10e3/uL 1.3   FILMS: No results found. Next mammogram will be due November of 2014   IMPRESSION:  58 y.o.  [place] woman status post right central lumpectomy  November 2012 for a T1c N0 (Stage IA) invasive ductal carcinoma, grade 1, strongly estrogen and progesterone receptor positive, with no HER-2 amplification, and an MIB-1 of 3%;   (1)the patient's ONCOTYPE DX reports a recurrence score of 7, predicting a risk of distant recurrence of 6% if her only systemic treatment is tamoxifen for 5 years;   (2)she completed radiation March 2013 at which point she started tamoxifen.  (3)status post simple hysterectomy in 2012  PLAN:  Bjorn Loser is is having hot flashes and vaginal dryness with tamoxifen. She says that she is able to tolerate okay. She developed side effects with Effexor and she stopped taking it. Her recent mammogram which was done in November 2014 was negative for any evidence of malignancy. Her CBC and differential and CMP is performed today is unremarkable. Continue tamoxifen 20 mg by mouth once daily. She is scheduled [facility] mamm[facility] in November 2015  I have discussed in detail with the patient on vaginal dryness and provided the   Literature to her with sample kits  she will see her surgeon, Dr. Margot Chimes, this November, she will see [place] again in one year.    Next followup visit in one year with CBC and differential and CMP prior to her next visit  Wilmon Arms, M.D. Medical oncology 01/27/2014

## 2014-01-27 NOTE — Telephone Encounter (Signed)
, °

## 2014-02-03 ENCOUNTER — Ambulatory Visit: Payer: BC Managed Care – PPO | Admitting: Physician Assistant

## 2014-02-24 ENCOUNTER — Other Ambulatory Visit: Payer: Self-pay | Admitting: *Deleted

## 2014-02-24 DIAGNOSIS — C50911 Malignant neoplasm of unspecified site of right female breast: Secondary | ICD-10-CM

## 2014-02-24 MED ORDER — TAMOXIFEN CITRATE 20 MG PO TABS: 20.0000 mg | ORAL_TABLET | Freq: Every day | ORAL | Status: DC

## 2014-04-12 ENCOUNTER — Encounter: Payer: Self-pay | Admitting: Family Medicine

## 2014-04-12 ENCOUNTER — Ambulatory Visit (INDEPENDENT_AMBULATORY_CARE_PROVIDER_SITE_OTHER): Payer: BC Managed Care – PPO | Admitting: Family Medicine

## 2014-04-12 VITALS — BP 106/71 | HR 66 | Ht 66.0 in | Wt 150.0 lb

## 2014-04-12 DIAGNOSIS — M533 Sacrococcygeal disorders, not elsewhere classified: Secondary | ICD-10-CM

## 2014-04-12 MED ORDER — METHYLPREDNISOLONE ACETATE 40 MG/ML IJ SUSP
40.0000 mg | Freq: Once | INTRAMUSCULAR | Status: AC
  Administered 2014-04-12: 40 mg via INTRA_ARTICULAR

## 2014-04-13 NOTE — Assessment & Plan Note (Signed)
Today RIGHT sided SI joint injection If not improved significantly in a few days or if she gets new sx, would order Xray pelvis (given hx breast cancer and melanoma) and she is aware to call back if not resolved

## 2014-04-13 NOTE — Progress Notes (Signed)
Patient ID: Stephanie Malone, female   DOB: August 13, 1956, 58 y.o.   MRN: [id number]  Stephanie Malone - 58 y.o. female MRN [id number]  Date of birth: 1956-02-01    SUBJECTIVE:     RIGHT sided buttock pain similar to event she had several years ago on left. Has been gradually worsening over last few weeks. Pain is intermittent and  4-5 /10 at worst. Bothers her to roll over in bed at night. No weakness, no bowel or bladder incontinence. No radiation into leg. ROS:     Pertinent review of systems: negative for fever or unusual weight change.   PERTINENT  PMH / PSH FH / / SH:  Past Medical, Surgical, Social, and Family History Reviewed & Updated in the EMR.  Pertinent findings include:  Breast cancer Hx melanoma  OBJECTIVE: BP 106/71  Pulse 66  Ht 5\' 6"  (1.676 m)  Wt 150 lb (68.04 kg)  BMI 24.22 kg/m2  Physical Exam:  Vital signs are reviewed. WD WN NAD Buttock mildly TTP mid right buttock. Neg trendelenburg.  HIPS: FROM IR/ER and painless EXT LE strength 5/5 B= BACK nontender, SLR neg B INJECTION: Patient was given informed consent, signed copy in the chart. Appropriate time out was taken. Area prepped and draped in usual sterile fashion. 1 cc of methylprednisolone 40 mg/ml plus  3 cc of 1% lidocaine without epinephrine was injected into the right SI joint using a(n) posterior approach. The patient tolerated the procedure well. There were no complications. Post procedure instructions were given.   ASSESSMENT & PLAN:  See problem based charting & AVS for pt instructions.

## 2014-06-18 ENCOUNTER — Other Ambulatory Visit: Payer: Self-pay | Admitting: Oncology

## 2014-06-18 DIAGNOSIS — Z853 Personal history of malignant neoplasm of breast: Secondary | ICD-10-CM

## 2014-07-23 ENCOUNTER — Ambulatory Visit
Admission: RE | Admit: 2014-07-23 | Discharge: 2014-07-23 | Disposition: A | Discharge status: Home / Self Care | Payer: BC Managed Care – PPO | Source: Ambulatory Visit | Attending: Oncology | Admitting: Oncology

## 2014-07-23 DIAGNOSIS — Z853 Personal history of malignant neoplasm of breast: Secondary | ICD-10-CM

## 2014-12-09 ENCOUNTER — Ambulatory Visit (INDEPENDENT_AMBULATORY_CARE_PROVIDER_SITE_OTHER): Payer: BLUE CROSS/BLUE SHIELD | Admitting: Sports Medicine

## 2014-12-09 ENCOUNTER — Encounter: Payer: Self-pay | Admitting: Sports Medicine

## 2014-12-09 VITALS — BP 113/56 | Ht 65.5 in | Wt 155.0 lb

## 2014-12-09 DIAGNOSIS — M79671 Pain in right foot: Secondary | ICD-10-CM | POA: Diagnosis not present

## 2014-12-09 DIAGNOSIS — M25571 Pain in right ankle and joints of right foot: Secondary | ICD-10-CM

## 2014-12-09 DIAGNOSIS — M79672 Pain in left foot: Secondary | ICD-10-CM | POA: Diagnosis not present

## 2014-12-09 DIAGNOSIS — M25551 Pain in right hip: Secondary | ICD-10-CM

## 2014-12-09 NOTE — Progress Notes (Signed)
  Stephanie Malone - 59 y.o. female MRN [id number]  Date of birth: November 26, 1955  SUBJECTIVE:  Including CC & ROS.  No chief complaint on file.  Stephanie Malone is a 59 y.o. female presenting with right hip pain, and pain in both feet. She states right hip pain started about 5-6 months ago. It is primarily on the right trochanter, can radiate down into outside thigh down to level of the knee. She had prior issues with her SI joint and says this pain is different. Her right foot has been hurting her behind the outside ankle, and will move down into the outside of the right foot at times. Her left foot pain occurred after an injury several months ago, pain is on the inside of the left foot along the side, somewhat on the top of the length of the first toe.   Note patient has lost 30 lbs in exercise with [place]  Hx of Breast CA and Tamoxifen Tx   HISTORY: Past Medical, Surgical, Social, and Family History Reviewed & Updated per EMR.   Pertinent Historical Findings include: SI joint dysfunction Lumbar degenerative disease   PHYSICAL EXAM:  VS: BP:(!) 113/56 mmHg  HR: bpm  TEMP: ( )  RESP:   HT:5' 5.5" (166.4 cm)   WT:155 lb (70.308 kg)  BMI:25.5 PHYSICAL EXAM: General: NAD Hip: INSPECTION: normal appearance  PALPATION: minimal tenderness greater trochanter RANGE OF MOTION: intact STRENGTH: 4+/5 isolating gluteus medius with abduction, 5/5 on right. Otherwise strength abduction normal NEUROVASCULAR STATUS: intact Foot/Calf: INSPECTION: normal right foot. Left foot notable 1st ray TMT bossing, loss of transverse arch with splaying of 2nd and 3rd digits. Right foot minimal loss of transverse arch. PALPATION: nontender to palp RANGE OF MOTION: intact STRENGTH: significant weakness of posterior tibialis with calf raises NEUROVASCULAR STATUS: intact   ASSESSMENT & PLAN: See problem based charting & AVS for pt instructions.

## 2014-12-09 NOTE — Patient Instructions (Addendum)
Bring your athletic shoes to next visit   Exercises:  One foot balance cone touching: stand on right foot (primarily work on this foot), cones out in front (or similar height object), reach down with right hand and touch cone, then reach down with left hand and touch cone. Do 10 reps, 3 sets.   Calf raises: stand on step, do calf raises with foot forward x 10 reps, foot external rotated x 10 reps, internally rotated x 10 reps. 3 sets if able, repeat on left leg.  Hip:  Hip abduction: Can start doing this while standing, then convert to laying down: bring leg straight out to the side.  Standing leg rotation: right knee bent out in front, rotate out to side, then return to midline. Lateral step up: get a platform and do side steps onto the step.

## 2014-12-09 NOTE — Assessment & Plan Note (Signed)
Some weakness of right hip abductors (primarily gluteus medius). Counseled regarding strengthening exercises including lateral raises, standing rotators, and lateral step up. F/u around 6 weeks.

## 2015-01-12 ENCOUNTER — Ambulatory Visit: Payer: BLUE CROSS/BLUE SHIELD | Admitting: Sports Medicine

## 2015-01-12 ENCOUNTER — Ambulatory Visit: Payer: Self-pay | Admitting: Sports Medicine

## 2015-01-21 ENCOUNTER — Other Ambulatory Visit: Payer: Self-pay | Admitting: *Deleted

## 2015-01-21 DIAGNOSIS — C50119 Malignant neoplasm of central portion of unspecified female breast: Secondary | ICD-10-CM

## 2015-01-24 ENCOUNTER — Other Ambulatory Visit (HOSPITAL_BASED_OUTPATIENT_CLINIC_OR_DEPARTMENT_OTHER): Payer: BLUE CROSS/BLUE SHIELD

## 2015-01-24 DIAGNOSIS — C50119 Malignant neoplasm of central portion of unspecified female breast: Secondary | ICD-10-CM

## 2015-01-24 DIAGNOSIS — C50111 Malignant neoplasm of central portion of right female breast: Secondary | ICD-10-CM | POA: Diagnosis not present

## 2015-01-24 LAB — CBC WITH DIFFERENTIAL/PLATELET
BASO%: 0.5 % (ref 0.0–2.0)
Basophils Absolute: 0 10*3/uL (ref 0.0–0.1)
EOS%: 2.1 % (ref 0.0–7.0)
Eosinophils Absolute: 0.1 10*3/uL (ref 0.0–0.5)
HCT: 40.2 % (ref 34.8–46.6)
HGB: 13.8 g/dL (ref 11.6–15.9)
LYMPH#: 1.6 10*3/uL (ref 0.9–3.3)
LYMPH%: 28.3 % (ref 14.0–49.7)
MCH: 30.9 pg (ref 25.1–34.0)
MCHC: 34.3 g/dL (ref 31.5–36.0)
MCV: 90.1 fL (ref 79.5–101.0)
MONO#: 0.4 10*3/uL (ref 0.1–0.9)
MONO%: 6.9 % (ref 0.0–14.0)
NEUT#: 3.5 10*3/uL (ref 1.5–6.5)
NEUT%: 62.2 % (ref 38.4–76.8)
Platelets: 246 10*3/uL (ref 145–400)
RBC: 4.46 10*6/uL (ref 3.70–5.45)
RDW: 12.5 % (ref 11.2–14.5)
WBC: 5.6 10*3/uL (ref 3.9–10.3)

## 2015-01-24 LAB — COMPREHENSIVE METABOLIC PANEL (CC13)
ALT: 15 U/L (ref 0–55)
AST: 20 U/L (ref 5–34)
Albumin: 3.6 g/dL (ref 3.5–5.0)
Alkaline Phosphatase: 62 U/L (ref 40–150)
Anion Gap: 12 mEq/L — ABNORMAL HIGH (ref 3–11)
BILIRUBIN TOTAL: 0.42 mg/dL (ref 0.20–1.20)
BUN: 8.9 mg/dL (ref 7.0–26.0)
CO2: 22 mEq/L (ref 22–29)
CREATININE: 0.8 mg/dL (ref 0.6–1.1)
Calcium: 8.6 mg/dL (ref 8.4–10.4)
Chloride: 108 mEq/L (ref 98–109)
EGFR: 81 mL/min/{1.73_m2} — ABNORMAL LOW (ref >90)
Glucose: 98 mg/dl (ref 70–140)
Potassium: 4.9 mEq/L (ref 3.5–5.1)
SODIUM: 142 meq/L (ref 136–145)
Total Protein: 6.3 g/dL — ABNORMAL LOW (ref 6.4–8.3)

## 2015-01-31 ENCOUNTER — Telehealth: Payer: Self-pay | Admitting: Oncology

## 2015-01-31 ENCOUNTER — Ambulatory Visit (HOSPITAL_BASED_OUTPATIENT_CLINIC_OR_DEPARTMENT_OTHER): Payer: BLUE CROSS/BLUE SHIELD | Admitting: Oncology

## 2015-01-31 VITALS — BP 119/67 | HR 70 | Temp 97.8°F | Resp 18 | Ht 65.5 in | Wt 159.4 lb

## 2015-01-31 DIAGNOSIS — Z170 Estrogen receptor positive status [ER+]: Secondary | ICD-10-CM | POA: Diagnosis not present

## 2015-01-31 DIAGNOSIS — C50111 Malignant neoplasm of central portion of right female breast: Secondary | ICD-10-CM | POA: Diagnosis not present

## 2015-01-31 DIAGNOSIS — N951 Menopausal and female climacteric states: Secondary | ICD-10-CM

## 2015-01-31 DIAGNOSIS — C50911 Malignant neoplasm of unspecified site of right female breast: Secondary | ICD-10-CM

## 2015-01-31 MED ORDER — MEGESTROL ACETATE 20 MG PO TABS: 20.0000 mg | ORAL_TABLET | Freq: Two times a day (BID) | ORAL | Status: DC

## 2015-01-31 MED ORDER — TAMOXIFEN CITRATE 20 MG PO TABS: 20.0000 mg | ORAL_TABLET | Freq: Every day | ORAL | Status: DC

## 2015-01-31 NOTE — Telephone Encounter (Signed)
Gave avs & calendar for March 2017. Sent message to [employer] for BSNP.

## 2015-01-31 NOTE — Progress Notes (Signed)
[id number]   CSN: [id number]   CC:   Stephanie Malone, M.D. Haywood Lasso, M.D. Blair Promise, Ph.D., M.D. Herma Mering, MD Berneta Sages, M.D.  CHIEF COMPLAINT: Estrogen receptor positive breast cancer  CURRENT TREATMENT: Tamoxifen  HPI: From the original intake note:    The patient had an unremarkable screening mammogram in June of 2011 at the [facility].  However, she noted what looked to her like nipple inversion.  She brought this to Dr. Danna Hefty attention and he set her up for diagnostic mammography at the [facility], July 09, 2011.  This showed a heterogeneously dense breast pattern and new mild inversion of the right nipple.  There was also subtle distortion in the lateral subareolar portion of the right breast. Mammography also showed a small nodule medially within the right breast which was an interval change.  The left breast was unremarkable.  On physical exam, Dr. Glennon Mac noted the nipple inversion but no palpable mass and no palpable right axillary adenopathy.  Ultrasonography showed an irregularly marginated hypoechoic mass within the subareolar portion of the right breast at the 9 o'clock position measuring 1.3 cm.  There was also an intramammary lymph node with asymmetrical cortical thickening at the 4 o'clock position in the right breast.  This measured 6 mm.  The ultrasound of the axilla was unremarkable.  With this information, ultrasound-guided core biopsy of the right breast mass was performed.  On the initial pass, the mass collapsed consistent with a complicated cyst.  The pathology from this procedure (SAA 12- [id number]) showed an invasive mammary carcinoma, grade 1 which was strongly estrogen and progesterone receptor positive at 99% and 100% respectively.  The MIB-1 was 3%.  There was no amplification of HER-2 by CISH, the ratio being 1.43.  Bilateral breast MRIs were obtained July 13, 2011.  This again showed the right lateral sub  areolar mass measuring 1.5 cm.  The other area of the right breast, which had also been previously biopsied, had been shown to be benign. The patient proceeded to definitive Right lumpectomy and sentinel lymph node sampling August 15, 2011. Her subsequent history is as detailed below.  Interval history:  [name] returns today for follow-up of her stage I breast cancer per she continues on tamoxifen, generally with good tolerance. The big problem is hot flashes. She has between 2 and 10 hot flashes daily and then 2 or 3 times at night. This does wake her up. She does not have vaginal wetness issues. She understands weight gain is not a side effect of tamoxifen.  ROS: She exercises by doing a port simulation and also some weights. A detailed review of systems today was otherwise stable.   Past Medical History  Diagnosis Date  . Breast cancer, IDC, Right, ER+,PR+, HER2- 07/10/2011  . Migraine   . Degenerative lumbar disc   . Anxiety   . Chronic back pain   . Asthma    Past Surgical History  Procedure Laterality Date  . Abdominal hysterectomy      and rectocele repair  . Bladder suspension    . Endometrial ablation    . Mastectomy partial / lumpectomy  [id number]    right central with SLN - Dr Margot Chimes, right  . Breast lumpectomy  08/15/2011    Procedure: BREAST LUMPECTOMY WITH EXCISION OF SENTINEL NODE;  Surgeon: Haywood Lasso, MD;  Location: [place];  Service: General;  Laterality: Right;     FAMILY HISTORY: Family History  Problem Relation Age of Onset  . Liver cancer Father   . Pancreatic cancer Paternal Uncle   . Multiple myeloma Paternal Grandmother   . Stroke Mother   . Cirrhosis Father   . Colon polyps Father   . Heart disease Mother   . Diabetes Mother   . Diabetes Father     The patient's father died at age 76 with hepatocellular carcinoma secondary to cryptogenic cirrhosis.  The patient's mother died at age 59.  [name] has 1 brother, no sisters.   There is no history of breast or ovarian cancer in the family.   GYNECOLOGIC HISTORY:  She had menarche age 52.  Status post simple hysterectomy in 2012 She is GX, P3, 1st pregnancy to term at age 23, which she understands is a risk factor for breast cancer.  The patient used oral contraceptives for approximately 5 years remotely.  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  Her husband goes by "Stephanie Malone".  Their sons are Stephanie Malone, and Stephanie Malone, the youngest, who is graduating from high school this month.   HEALTH MAINTENANCE:  The patient smoked minimally, perhaps as much as 5- pack-years.  She quit in 1995.  She does not use alcohol.  She had a colonoscopy in October of 2011, a bone density in 2010 which was normal (there is a copy in the chart), most recent Pap smear September of 2011.  ALLERGIES:  No known drug allergies.  MEDICATIONS:    Current outpatient prescriptions:  .  ALPRAZolam (XANAX) 0.5 MG tablet, Take 0.5 mg by mouth at bedtime as needed.  , Disp: , Rfl:  .  esomeprazole (NEXIUM) 40 MG packet, Take 40 mg by mouth daily before breakfast., Disp: 30 each, Rfl: 1 .  ibuprofen (ADVIL,MOTRIN) 200 MG tablet, Take 800 mg by mouth every 6 (six) hours as needed., Disp: , Rfl:  .  naproxen sodium (ANAPROX) 220 MG tablet, Take 220 mg by mouth as needed. , Disp: , Rfl:  .  Omega-3 Fatty Acids (FISH OIL) 1000 MG CAPS, Take by mouth., Disp: , Rfl:  .  SUMAtriptan (IMITREX) 100 MG tablet, Take 100 mg by mouth as needed.  , Disp: , Rfl:  .  tamoxifen (NOLVADEX) 20 MG tablet, Take 1 tablet (20 mg total) by mouth daily. Started 2nd march, 2013,, Disp: 90 tablet, Rfl: 12 .  traZODone (DESYREL) 100 MG tablet, Take 100 mg by mouth at bedtime.  , Disp: , Rfl:  .  Turmeric 500 MG CAPS, Take by mouth 2 (two) times daily., Disp: , Rfl:   PHYSICAL EXAM:  middle-aged white woman who appears well Filed Vitals:   01/31/15 1106   BP: 119/67  Pulse: 70  Temp: 97.8 F (36.6 C)  Resp: 18   Sclerae unicteric, pupils equal and reactive Oropharynx clear and moist-- no thrush or other lesions No cervical or supraclavicular adenopathy Lungs no rales or rhonchi Heart regular rate and rhythm Abd soft, nontender, positive bowel sounds MSK no focal spinal tenderness, no upper extremity lymphedema Neuro: nonfocal, well oriented, appropriate affect Breasts: The right breast is status post lumpectomy, with nipple removal. There is no evidence of local recurrence. The right axilla is benign. Left breast is unremarkable.  FILMS:  CLINICAL DATA: Personal history of right breast cancer status post lumpectomy 2012  EXAM: DIGITAL DIAGNOSTIC BILATERAL MAMMOGRAM WITH 3D TOMOSYNTHESIS AND CAD  COMPARISON: July 20, 2013, July 18, 2012, July 10, 2011, July 09, 2011  ACR Breast Density Category b: There are scattered areas of fibroglandular density.  FINDINGS: Cc and MLO views of bilateral breasts, spot tangential view of right breast are submitted. Stable postsurgical changes are identified within the right breast. No suspicious abnormality is identified within the left breast.  Mammographic images were processed with CAD.  IMPRESSION: Benign findings.  RECOMMENDATION: Bilateral diagnostic mammogram in 1 year.  I have discussed the findings and recommendations with the patient. Results were also provided in writing at the conclusion of the visit. If applicable, a reminder letter will be sent to the patient regarding the next appointment.  BI-RADS CATEGORY 2: Benign.   Electronically Signed  By: Abelardo Diesel M.D.  On: 07/23/2014 15:16  IMPRESSION:  59 y.o.  [place] woman status post right central lumpectomy November 2012 for a T1c N0 (Stage IA) invasive ductal carcinoma, grade 1, strongly estrogen and progesterone receptor positive, with no HER-2 amplification, and an MIB-1 of 3%;    (1)the patient's ONCOTYPE DX reports a recurrence score of 7, predicting a risk of distant recurrence of 6% if her only systemic treatment is tamoxifen for 5 years;   (2)she completed radiation March 2013 at which point she started tamoxifen.  (3)status post simple hysterectomy in 2012  PLAN:  Louretta is doing fine on the tamoxifen except for the hot flashes problem. This can be significant some days and it frequently wakes her up at night. She tried Effexor for this, but she has hated it.   I think she might be a good candidate for megestrol. And we will give at the 20 mg tablet a try at bedtime. If that works well for her, she can try twice daily. If the 20 mg tablet at bedtime does not work she can double the dose to 40 mg. Once she feels she is on a stable dose she will give [place] a call and let us know how it is working.  Otherwise she will return to see our breast survivorship nurse practitioner a year from now and then to see me again in 2 years. At that visit, March 2018, she will be ready to "graduate" from breast cancer follow-up.

## 2015-02-02 ENCOUNTER — Ambulatory Visit: Payer: BLUE CROSS/BLUE SHIELD | Admitting: Sports Medicine

## 2015-03-01 ENCOUNTER — Encounter: Payer: Self-pay | Admitting: Sports Medicine

## 2015-03-01 ENCOUNTER — Ambulatory Visit (INDEPENDENT_AMBULATORY_CARE_PROVIDER_SITE_OTHER): Payer: BLUE CROSS/BLUE SHIELD | Admitting: Sports Medicine

## 2015-03-01 VITALS — BP 124/52 | Ht 66.0 in | Wt 160.0 lb

## 2015-03-01 DIAGNOSIS — M25562 Pain in left knee: Secondary | ICD-10-CM

## 2015-03-01 DIAGNOSIS — M25551 Pain in right hip: Secondary | ICD-10-CM | POA: Diagnosis not present

## 2015-03-01 DIAGNOSIS — M79671 Pain in right foot: Secondary | ICD-10-CM

## 2015-03-01 DIAGNOSIS — M79672 Pain in left foot: Secondary | ICD-10-CM | POA: Diagnosis not present

## 2015-03-01 DIAGNOSIS — G8929 Other chronic pain: Secondary | ICD-10-CM | POA: Insufficient documentation

## 2015-03-01 MED ORDER — METHYLPREDNISOLONE ACETATE 40 MG/ML IJ SUSP
40.0000 mg | Freq: Once | INTRAMUSCULAR | Status: AC
  Administered 2015-03-01: 40 mg via INTRA_ARTICULAR

## 2015-03-01 NOTE — Progress Notes (Signed)
Patient ID: Shelby Mattocks, female   DOB: 13-Mar-1956, 59 y.o.   MRN: [id number]  RT hip follow up Better but still there  Over RT GT Hurts  To sleep on side   RT foot Better with HEP  Lt arch pain Has OTC inserts  Let Ant knee pain Hurts going down steps  NAD Pleasant BP 124/52 mmHg  Ht 5\' 6"  (1.676 m)  Wt 160 lb (72.576 kg)  BMI 25.84 kg/m2  RT hip full ROM Still with TTP over grt Troch Weakness in abductin otherwisse strong  LT Knee: Normal to inspection with no erythema or effusion or obvious bony abnormalities. Palpation normal with no warmth or joint line tenderness or patellar tenderness or condyle tenderness. ROM normal in flexion and extension and lower leg rotation. Ligaments with solid consistent endpoints including ACL, PCL, LCL, MCL. Negative Mcmurray's and provocative meniscal tests. Non painful patellar compression. Patellar and quadriceps tendons unremarkable. Hamstring and quadriceps strength is normal with exception of left VMO which appears weak  Step down shows dynamic genu valgus  [place]  Small spur on post Grt Troch RT Hypoechoic change around spur and Glut Med tendon No tendon tear

## 2015-03-01 NOTE — Patient Instructions (Signed)
Perform exercises as listed Left knee extensions with ball between knees, 3x15 Left leg lift with foot turned out 3x15  Right leg lateral lifts 3x15

## 2015-03-01 NOTE — Assessment & Plan Note (Signed)
Procedure:  Injection of Rt Greater Troch Consent obtained and verified. Time-out conducted. Noted no overlying erythema, induration, or other signs of local infection. Skin prepped in a sterile fashion. Spur and hypoechoic area identified on [place] Injected directed to area of spurring Topical analgesic spray: Ethyl chloride. Completed without difficulty. Meds: 1cc Solumedrol 40 + 4 cc lidocaine 1%  Pain immediately improved suggesting accurate placement of the medication. Advised to call if fevers/chills, erythema, induration, drainage, or persistent bleeding.   Keep up hip abduction exercises  Reck 6 wks

## 2015-03-01 NOTE — Assessment & Plan Note (Signed)
PFPS sxs  Work on [employer] strength with HEP  Try patellar strap

## 2015-03-01 NOTE — Assessment & Plan Note (Signed)
RT foot has improved  Left with arch pain  Add schaphoid pads to OTC orthotics  Consider custom orthotics if persistent

## 2015-04-05 ENCOUNTER — Ambulatory Visit (INDEPENDENT_AMBULATORY_CARE_PROVIDER_SITE_OTHER): Payer: BLUE CROSS/BLUE SHIELD | Admitting: Sports Medicine

## 2015-04-05 ENCOUNTER — Encounter: Payer: Self-pay | Admitting: Sports Medicine

## 2015-04-05 VITALS — BP 128/72 | Ht 65.5 in | Wt 160.0 lb

## 2015-04-05 DIAGNOSIS — M25562 Pain in left knee: Secondary | ICD-10-CM | POA: Diagnosis not present

## 2015-04-05 NOTE — Patient Instructions (Signed)
Continue exercises we talked about before. Add to it: 1. Single leg 10 degrees squat- do 10-15 times, 3 sets 2. Straight leg raises- 10-15 times, 3 sets (add an ankle weight if you can)  Continue icing often. Use compression sleeve with activity. You can do exercise classes but avoid jumping and deep squats.

## 2015-04-05 NOTE — Assessment & Plan Note (Addendum)
See patient instructions. She will have to modify exercise classes Patient given quadriceps exercises, instructed to use compression sleeve with activity, ice regularly.  Will follow up in 6 weeks to see if improvement of swelling.

## 2015-04-05 NOTE — Progress Notes (Signed)
Subjective:     Patient ID: Stephanie Malone, female   DOB: 07-05-1956, 59 y.o.   MRN: [id number]  HPI Stephanie Malone is a 38 year old presenting for follow up for left knee pain and right hip pain.  Her right hip pain has significantly improved since injection of greater trochanter on June 14th.   Her left knee has gotten worse over the past month. She localizes the pain mainly to her medial knee. Around the time it started to hurt, she was helping to move her son into his apartment and was making several trips up 4 flights of stairs carrying heavy boxes. She also was doing work out classes that involved jumping and squats. Since the pain has been present, she has stopped doing squats and has been trying to modify her activities to decrease the pain. She has noticed swelling in her knees and has using ice and anti-inflammatories. She has bought a couple different knee braces from the drug store but they aggravate the posterior part of her knee so she does not wear them. She denies any acute trauma to the knee.  Review of Systems Per HPI.    Objective:   Physical Exam Gen: [place] woman, NAD BP 128/72 mmHg  Ht 5' 5.5" (1.664 m)  Wt 160 lb (72.576 kg)  BMI 26.21 kg/m2  MSK:  L knee: Swelling noted in Supra-patellar pouch and along medial portion of knee. No erythema, ecchymosis. Full ROM with crepitation. Tenderness to palpation along medial joint line. Negative Mcmurrays, but does have pain with thessaly test. No pain with valgus or varus stress. No pain with patellar compression. Good strength with extension, flexion of knee. Distal extremity neurovascularly intact.  Right hip: Full ROM. No tenderness to palpation over greater trochanter. Good strength with hip adduction, abduction.   U/S of L knee: Suprapatellar effusion. Calcifcation and small spur on lateral patella with small bone spurs noted in the lat trochlear groove. No tears noted in medial or lateral meniscus. Remainder of knee scan  not remarkable. Assessment:     See problem focused A/P     Plan:     See problem focused A/P

## 2015-05-17 ENCOUNTER — Other Ambulatory Visit: Payer: BLUE CROSS/BLUE SHIELD | Admitting: Sports Medicine

## 2015-06-14 ENCOUNTER — Ambulatory Visit
Admission: RE | Admit: 2015-06-14 | Discharge: 2015-06-14 | Disposition: A | Discharge status: Home / Self Care | Payer: BLUE CROSS/BLUE SHIELD | Source: Ambulatory Visit | Attending: Sports Medicine | Admitting: Sports Medicine

## 2015-06-14 ENCOUNTER — Encounter: Payer: Self-pay | Admitting: Sports Medicine

## 2015-06-14 ENCOUNTER — Other Ambulatory Visit: Payer: Self-pay | Admitting: Sports Medicine

## 2015-06-14 ENCOUNTER — Ambulatory Visit (INDEPENDENT_AMBULATORY_CARE_PROVIDER_SITE_OTHER): Payer: BLUE CROSS/BLUE SHIELD | Admitting: Sports Medicine

## 2015-06-14 VITALS — BP 132/73 | HR 79 | Ht 66.0 in | Wt 160.0 lb

## 2015-06-14 DIAGNOSIS — M25551 Pain in right hip: Secondary | ICD-10-CM

## 2015-06-14 DIAGNOSIS — M25562 Pain in left knee: Secondary | ICD-10-CM | POA: Diagnosis not present

## 2015-06-14 MED ORDER — TRAMADOL HCL 50 MG PO TABS: 50.0000 mg | ORAL_TABLET | Freq: Two times a day (BID) | ORAL | Status: DC | PRN

## 2015-06-14 NOTE — Progress Notes (Signed)
Subjective:    Patient ID: Stephanie Malone, female    DOB: [birth year], 59 y.o.   MRN: [id number]  HPI Comments: Stephanie Malone is a 59 year old woman with PMH as below here for follow-up of right hip pain (x 1 year) and L knee pain.  Her knee pain has improved about 50% since she has been doing home exercises for PFS.  She received a cortisone shot to right greater trochanter at June visit and feels this gave her significant relief for about two months but the pain returned one month ago.  The pain is located at the right lateral thigh pain (occasionally anterior groin) and is worse at night but improves about half way through the night (she is usually pain-free when she wakes to use the bathroom at night).  Her thigh and knee pain do not bother her when she exercises.  She has been taking Aleve 451m prn at night on some nights out of the week to help with limited relief.  She has been taking tamoxifen for Breast cancer for about 2.5 years.  She has not noticed weight loss.    Past Medical History  Diagnosis Date  . Breast cancer, IDC, Right, ER+,PR+, HER2- 07/10/2011  . Migraine   . Degenerative lumbar disc   . Anxiety   . Chronic back pain   . Asthma    Current Outpatient Prescriptions on File Prior to Visit  Medication Sig Dispense Refill  . ALPRAZolam (XANAX) 0.5 MG tablet Take 0.5 mg by mouth at bedtime as needed.      . naproxen sodium (ANAPROX) 220 MG tablet Take 220 mg by mouth as needed.     . SUMAtriptan (IMITREX) 100 MG tablet Take 100 mg by mouth as needed.      . tamoxifen (NOLVADEX) 20 MG tablet Take 1 tablet (20 mg total) by mouth daily. Started 2nd march, 2013, 90 tablet 12  . traZODone (DESYREL) 100 MG tablet Take 100 mg by mouth at bedtime.      . Omega-3 Fatty Acids (FISH OIL) 1000 MG CAPS Take by mouth.     No current facility-administered medications on file prior to visit.    Review of Systems  Constitutional: Negative for appetite change and unexpected weight change.   Gastrointestinal: Negative for diarrhea, constipation and blood in stool.       Denies change in bowel or bladder habits.  Neurological: Negative for numbness.       Filed Vitals:   06/14/15 1516  BP: 132/73  Pulse: 79  Height: _0  (1.676 m)  Weight: 160 lb (72.576 kg)     Objective:   Physical Exam  Constitutional: She is oriented to person, place, and time. She appears well-developed. No distress.  HENT:  Head: Normocephalic and atraumatic.  Musculoskeletal: Normal range of motion. She exhibits tenderness. She exhibits no edema.  FROM at B/L hip joints; FROM at B/L knees.  Negative valgus/varus stress, neg anterior/posterior drawer, neg Lachman, neg McMurray.  She is TTP along right lateral thigh.  Neurological: She is alert and oriented to person, place, and time.  5/5 upper extremity strength (including hip abd/adduction, flexion and extension)  Skin: Skin is warm. She is not diaphoretic.  Psychiatric: She has a normal mood and affect. Her behavior is normal. Judgment and thought content normal.  Vitals reviewed.  She is less TTP over the RT Grt Troch  Xray shows minimal change in hip.  Probably facet joint DJD in lower lumbar  spine       Assessment & Plan:  Please see problem based charting for assessment and plan.

## 2015-06-14 NOTE — Assessment & Plan Note (Signed)
No swelling or tenderness on exam.  No evidence of ligamentous or meniscal injury.  Her strength has improved and she notes improvement. - continue exercises as tolerated

## 2015-06-14 NOTE — Assessment & Plan Note (Addendum)
Had improvement with steroid but pain has returned.  Nighttime pain is concerning in this patient with breast cancer and on tamoxifen.  Tamoxifen can also be associated with osteoporosis and MSK pain. - hip xray shows no real lesion at hip or GRT troch/  and may consider additional images if this does not resolve as she does have facet changes in Low lumbar - rx for Tramadol 50mg  q12h prn #60 with 1 refill for pain control - continue exercise as tolerated (she is not having pain with exercise)  Reck 6 weeks to follow up

## 2015-06-16 ENCOUNTER — Telehealth: Payer: Self-pay | Admitting: *Deleted

## 2015-06-16 NOTE — Telephone Encounter (Signed)
Left msg for patient, see below

## 2015-06-16 NOTE — Telephone Encounter (Signed)
-----   Message from Stefanie Libel, MD sent at 06/14/2015 11:50 PM EDT ----- Let her know that XRays were OK  Try the tramadol  Keep up exercises  Reck 6 wks unless worth

## 2015-07-08 ENCOUNTER — Other Ambulatory Visit: Payer: Self-pay | Admitting: Oncology

## 2015-07-08 DIAGNOSIS — C50911 Malignant neoplasm of unspecified site of right female breast: Secondary | ICD-10-CM

## 2015-07-08 DIAGNOSIS — Z9889 Other specified postprocedural states: Secondary | ICD-10-CM

## 2015-08-17 ENCOUNTER — Ambulatory Visit
Admission: RE | Admit: 2015-08-17 | Discharge: 2015-08-17 | Disposition: A | Discharge status: Home / Self Care | Payer: BLUE CROSS/BLUE SHIELD | Source: Ambulatory Visit | Attending: Oncology | Admitting: Oncology

## 2015-08-17 DIAGNOSIS — Z9889 Other specified postprocedural states: Secondary | ICD-10-CM

## 2015-08-17 DIAGNOSIS — C50911 Malignant neoplasm of unspecified site of right female breast: Secondary | ICD-10-CM

## 2015-11-25 ENCOUNTER — Telehealth: Payer: Self-pay | Admitting: Oncology

## 2015-11-25 NOTE — Telephone Encounter (Signed)
pt called to r/s appt...pt ok and aware

## 2015-11-28 ENCOUNTER — Other Ambulatory Visit: Payer: BLUE CROSS/BLUE SHIELD

## 2015-12-26 ENCOUNTER — Other Ambulatory Visit: Payer: Self-pay | Admitting: Internal Medicine

## 2016-01-25 ENCOUNTER — Other Ambulatory Visit (HOSPITAL_BASED_OUTPATIENT_CLINIC_OR_DEPARTMENT_OTHER): Payer: BLUE CROSS/BLUE SHIELD

## 2016-01-25 DIAGNOSIS — C50911 Malignant neoplasm of unspecified site of right female breast: Secondary | ICD-10-CM

## 2016-01-25 DIAGNOSIS — C50111 Malignant neoplasm of central portion of right female breast: Secondary | ICD-10-CM | POA: Diagnosis not present

## 2016-01-25 LAB — COMPREHENSIVE METABOLIC PANEL
ALBUMIN: 3.8 g/dL (ref 3.5–5.0)
ALK PHOS: 66 U/L (ref 40–150)
ALT: 16 U/L (ref 0–55)
ANION GAP: 6 meq/L (ref 3–11)
AST: 25 U/L (ref 5–34)
BILIRUBIN TOTAL: 0.61 mg/dL (ref 0.20–1.20)
BUN: 12.4 mg/dL (ref 7.0–26.0)
CO2: 27 mEq/L (ref 22–29)
Calcium: 9 mg/dL (ref 8.4–10.4)
Chloride: 105 mEq/L (ref 98–109)
Creatinine: 0.9 mg/dL (ref 0.6–1.1)
EGFR: 72 mL/min/{1.73_m2} — AB (ref >90)
Glucose: 68 mg/dl — ABNORMAL LOW (ref 70–140)
POTASSIUM: 4.1 meq/L (ref 3.5–5.1)
Sodium: 138 mEq/L (ref 136–145)
TOTAL PROTEIN: 6.8 g/dL (ref 6.4–8.3)

## 2016-01-25 LAB — CBC WITH DIFFERENTIAL/PLATELET
BASO%: 0.9 % (ref 0.0–2.0)
BASOS ABS: 0.1 10*3/uL (ref 0.0–0.1)
EOS ABS: 0.2 10*3/uL (ref 0.0–0.5)
EOS%: 2.6 % (ref 0.0–7.0)
HEMATOCRIT: 42 % (ref 34.8–46.6)
HEMOGLOBIN: 14 g/dL (ref 11.6–15.9)
LYMPH#: 1.6 10*3/uL (ref 0.9–3.3)
LYMPH%: 26.3 % (ref 14.0–49.7)
MCH: 30 pg (ref 25.1–34.0)
MCHC: 33.4 g/dL (ref 31.5–36.0)
MCV: 89.8 fL (ref 79.5–101.0)
MONO#: 0.4 10*3/uL (ref 0.1–0.9)
MONO%: 6.3 % (ref 0.0–14.0)
NEUT%: 63.9 % (ref 38.4–76.8)
NEUTROS ABS: 3.8 10*3/uL (ref 1.5–6.5)
Platelets: 260 10*3/uL (ref 145–400)
RBC: 4.68 10*6/uL (ref 3.70–5.45)
RDW: 12.7 % (ref 11.2–14.5)
WBC: 6 10*3/uL (ref 3.9–10.3)

## 2016-02-01 ENCOUNTER — Other Ambulatory Visit: Payer: Self-pay | Admitting: *Deleted

## 2016-02-01 ENCOUNTER — Encounter: Payer: Self-pay | Admitting: Nurse Practitioner

## 2016-02-01 ENCOUNTER — Ambulatory Visit (HOSPITAL_BASED_OUTPATIENT_CLINIC_OR_DEPARTMENT_OTHER): Payer: BLUE CROSS/BLUE SHIELD | Admitting: Nurse Practitioner

## 2016-02-01 ENCOUNTER — Telehealth: Payer: Self-pay | Admitting: Oncology

## 2016-02-01 VITALS — BP 122/67 | HR 75 | Temp 97.9°F | Resp 18 | Ht 66.0 in | Wt 164.5 lb

## 2016-02-01 DIAGNOSIS — Z170 Estrogen receptor positive status [ER+]: Secondary | ICD-10-CM

## 2016-02-01 DIAGNOSIS — C50111 Malignant neoplasm of central portion of right female breast: Secondary | ICD-10-CM | POA: Diagnosis not present

## 2016-02-01 DIAGNOSIS — N904 Leukoplakia of vulva: Secondary | ICD-10-CM | POA: Diagnosis not present

## 2016-02-01 MED ORDER — ESTRADIOL 10 MCG VA TABS: 1.0000 | ORAL_TABLET | VAGINAL | Status: DC

## 2016-02-01 MED ORDER — TAMOXIFEN CITRATE 20 MG PO TABS: 20.0000 mg | ORAL_TABLET | Freq: Every day | ORAL | Status: DC

## 2016-02-01 NOTE — Telephone Encounter (Signed)
Gave and printed appt sched and avs fo rpt for March 2018

## 2016-02-01 NOTE — Progress Notes (Signed)
[id number]   CSN: [id number]  CC:   Selinda Orion, M.D. Haywood Lasso, M.D. Blair Promise, Ph.D., M.D. Herma Mering, MD Berneta Sages, M.D.  CHIEF COMPLAINT: Estrogen receptor positive breast cancer  CURRENT TREATMENT: Tamoxifen  BREAST CANCER HISTORY: From the original intake note:  The patient had an unremarkable screening mammogram in June of 2011 at the [facility].  However, she noted what looked to her like nipple inversion.  She brought this to Dr. Danna Hefty attention and he set her up for diagnostic mammography at the [facility], July 09, 2011.  This showed a heterogeneously dense breast pattern and new mild inversion of the right nipple.  There was also subtle distortion in the lateral subareolar portion of the right breast. Mammography also showed a small nodule medially within the right breast which was an interval change.  The left breast was unremarkable.  On physical exam, Dr. Glennon Mac noted the nipple inversion but no palpable mass and no palpable right axillary adenopathy.  Ultrasonography showed an irregularly marginated hypoechoic mass within the subareolar portion of the right breast at the 9 o'clock position measuring 1.3 cm.  There was also an intramammary lymph node with asymmetrical cortical thickening at the 4 o'clock position in the right breast.  This measured 6 mm.  The ultrasound of the axilla was unremarkable. With this information, ultrasound-guided core biopsy of the right breast mass was performed.  On the initial pass, the mass collapsed consistent with a complicated cyst.  The pathology from this procedure (SAA [id number]) showed an invasive mammary carcinoma, grade 1 which was strongly estrogen and progesterone receptor positive at 99% and 100% respectively.  The MIB-1 was 3%.  There was no amplification of HER-2 by CISH, the ratio being 1.43. Bilateral breast MRIs were obtained July 13, 2011.  This again showed the right lateral sub areolar mass  measuring 1.5 cm.  The other area of the right breast, which had also been previously biopsied, had been shown to be benign. The patient proceeded to definitive Right lumpectomy and sentinel lymph node sampling August 15, 2011. Her subsequent history is as detailed below.  INTERVAL HISTORY: Paulla returns today for follow-up of her stage I breast cancer. She has been on tamoxifen since March 2013. She has hot flashes from this drug, but she is not interested in any other pills to help her with this. She has a history of arthritis and Madelung's Deformity, that has greatly reduced the range of motion to her wrists. She has tramadol to use PRN but she does not take this regularly. She also has a history of lichen sclerosus that causes tremendous vaginal dryness and atrophy. She was prescribed vagifem, but this would cost her $300 for a month's supply.  REVIEW OF SYSTEMS: A detailed review of systems is otherwise entirely stable, except where noted above.    Past Medical History  Diagnosis Date  . Breast cancer, IDC, Right, ER+,PR+, HER2- 07/10/2011  . Migraine   . Degenerative lumbar disc   . Anxiety   . Chronic back pain   . Asthma    Past Surgical History  Procedure Laterality Date  . Abdominal hysterectomy      and rectocele repair  . Bladder suspension    . Endometrial ablation    . Mastectomy partial / lumpectomy  [id number]    right central with SLN - Dr Margot Chimes, right  . Breast lumpectomy  08/15/2011    Procedure: BREAST LUMPECTOMY WITH EXCISION OF SENTINEL  NODE;  Surgeon: Haywood Lasso, MD;  Location: [facility];  Service: General;  Laterality: Right;     FAMILY HISTORY: Family History  Problem Relation Age of Onset  . Liver cancer Father   . Pancreatic cancer Paternal Uncle   . Multiple myeloma Paternal Grandmother   . Stroke Mother   . Cirrhosis Father   . Colon polyps Father   . Heart disease Mother   . Diabetes Mother   . Diabetes Father     The  patient's father died at age 27 with hepatocellular carcinoma secondary to cryptogenic cirrhosis.  The patient's mother died at age 72.  Tal has 1 brother, no sisters.  There is no history of breast or ovarian cancer in the family.   GYNECOLOGIC HISTORY:  She had menarche age 42.  Status post simple hysterectomy in 2012 She is GX, P3, 1st pregnancy to term at age 83, which she understands is a risk factor for breast cancer.  The patient used oral contraceptives for approximately 5 years remotely.  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  Her husband goes by "Barnabas Lister".  Their sons are Caryl Comes, and Clarise Cruz, the youngest, who is graduating from high school this month.   HEALTH MAINTENANCE:  The patient smoked minimally, perhaps as much as 5- pack-years.  She quit in 1995.  She does not use alcohol.  She had a colonoscopy in October of 2011, a bone density in 2010 which was normal (there is a copy in the chart), most recent Pap smear September of 2011.  ALLERGIES:  No known drug allergies.  MEDICATIONS:    Current Outpatient Prescriptions on File Prior to Visit  Medication Sig Dispense Refill  . ALPRAZolam (XANAX) 0.5 MG tablet Take 0.5 mg by mouth at bedtime as needed.      . Omega-3 Fatty Acids (FISH OIL) 1000 MG CAPS Take by mouth.    . traZODone (DESYREL) 100 MG tablet Take 100 mg by mouth at bedtime.      . naproxen sodium (ANAPROX) 220 MG tablet Take 220 mg by mouth as needed. Reported on 02/01/2016    . SUMAtriptan (IMITREX) 100 MG tablet Take 100 mg by mouth as needed. Reported on 02/01/2016    . traMADol (ULTRAM) 50 MG tablet Take 1 tablet (50 mg total) by mouth every 12 (twelve) hours as needed. (Patient not taking: Reported on 02/01/2016) 60 tablet 1   No current facility-administered medications on file prior to visit.   PHYSICAL EXAM:  middle-aged white woman who appears well Filed Vitals:   02/01/16  1150  BP: 122/67  Pulse: 75  Temp: 97.9 F (36.6 C)  Resp: 18    Skin: warm, dry  HEENT: sclerae anicteric, conjunctivae pink, oropharynx clear. No thrush or mucositis.  Lymph Nodes: No cervical or supraclavicular lymphadenopathy  Lungs: clear to auscultation bilaterally, no rales, wheezes, or rhonci  Heart: regular rate and rhythm  Abdomen: round, soft, non tender, positive bowel sounds  Musculoskeletal: No focal spinal tenderness, no peripheral edema  Neuro: non focal, well oriented, positive affect  Breasts:  Right breast status post lumpectomy and nipple removal. No evidence of recurrent disease. Right axilla benign. Left breast unremarkable.   LABS:  CMP Latest Ref Rng 01/25/2016 01/24/2015 01/20/2014  Glucose 70 - 140 mg/dl 68(L) 98 121  BUN 7.0 - 26.0 mg/dL 12.4 8.9 11.5  Creatinine 0.6 -  1.1 mg/dL 0.9 0.8 0.8  Sodium 136 - 145 mEq/L 138 142 144  Potassium 3.5 - 5.1 mEq/L 4.1 4.9 4.7  Chloride 98 - 107 mEq/L - - -  CO2 22 - 29 mEq/L '27 22 25  ' Calcium 8.4 - 10.4 mg/dL 9.0 8.6 9.1  Total Protein 6.4 - 8.3 g/dL 6.8 6.3(L) 6.3(L)  Total Bilirubin 0.20 - 1.20 mg/dL 0.61 0.42 0.41  Alkaline Phos 40 - 150 U/L 66 62 69  AST 5 - 34 U/L '25 20 20  ' ALT 0 - 55 U/L '16 15 16     ' CBC Latest Ref Rng 01/25/2016 01/24/2015 01/20/2014  WBC 3.9 - 10.3 10e3/uL 6.0 5.6 6.0  Hemoglobin 11.6 - 15.9 g/dL 14.0 13.8 13.6  Hematocrit 34.8 - 46.6 % 42.0 40.2 39.6  Platelets 145 - 400 10e3/uL 260 246 283    SCANS:  EXAM: DIGITAL DIAGNOSTIC BILATERAL MAMMOGRAM WITH 3D TOMOSYNTHESIS AND CAD  COMPARISON: 07/23/2014  ACR Breast Density Category c: The breast tissue is heterogeneously dense, which may obscure small masses.  FINDINGS: Postoperative changes are seen in the right breast. No suspicious mass, distortion, or microcalcifications are identified to suggest presence of malignancy.  Mammographic images were processed with CAD.  IMPRESSION: No mammographic evidence for  malignancy.  RECOMMENDATION: Diagnostic mammogram is suggested in 1 year. (Code:DM-B-01Y)  I have discussed the findings and recommendations with the patient. Results were also provided in writing at the conclusion of the visit. If applicable, a reminder letter will be sent to the patient regarding the next appointment.  BI-RADS CATEGORY 2: Benign.   Electronically Signed  By: Nolon Nations M.  IMPRESSION:  60 y.o.  [place] woman status post right central lumpectomy November 2012 for a T1c N0 (Stage IA) invasive ductal carcinoma, grade 1, strongly estrogen and progesterone receptor positive, with no HER-2 amplification, and an MIB-1 of 3%;   (1)the patient's ONCOTYPE DX reports a recurrence score of 7, predicting a risk of distant recurrence of 6% if her only systemic treatment is tamoxifen for 5 years;   (2)she completed radiation March 2013 at which point she started tamoxifen.  (3)status post simple hysterectomy in 2012  PLAN:  Suann is doing well as far as her breast cancer is concerned. She is now 4.5 years out from her definitive surgery with no evidence of recurrent disease. Her most recent mammogram was negative. Her labs are normal. She is tolerating the tamoxifen well and will continue this drug until March of next year to complete 5 years of antiestrogen therapy.   We discussed her extreme vaginal dryness. She is unable to insert the estring by herself due to her wrist deformity, and the Josph Macho procedure is too expensive at this point. She cannot afford the vagifem tablets either, but she asked for a printed prescription that she could use to shop around with [employer]. This was provided at her request, I only asked that she thoroughly review her pharmacy of choice before ordering anything.   Danelle is due for a repeat mammogram in November. She will return in March for follow up with Dr. Jana Hakim. During this visit she will be eligible to  "graduate" for follow up here, and discontinue antiestrogen therapy. She understands and agrees with this plan. She knows the goal of treatment in her case is cure. She has been encouraged to call with any issues that might arise before her next visit here.    Laurie Panda, NP 02/01/2016

## 2016-02-21 ENCOUNTER — Other Ambulatory Visit: Payer: Self-pay | Admitting: Oncology

## 2016-04-19 ENCOUNTER — Ambulatory Visit: Payer: BLUE CROSS/BLUE SHIELD | Admitting: Sports Medicine

## 2016-04-26 ENCOUNTER — Ambulatory Visit: Payer: BLUE CROSS/BLUE SHIELD | Admitting: Sports Medicine

## 2016-06-20 ENCOUNTER — Ambulatory Visit: Payer: BLUE CROSS/BLUE SHIELD | Admitting: Sports Medicine

## 2016-07-31 ENCOUNTER — Other Ambulatory Visit: Payer: Self-pay | Admitting: Oncology

## 2016-07-31 DIAGNOSIS — Z853 Personal history of malignant neoplasm of breast: Secondary | ICD-10-CM

## 2016-09-21 ENCOUNTER — Ambulatory Visit
Admission: RE | Admit: 2016-09-21 | Discharge: 2016-09-21 | Disposition: A | Discharge status: Home / Self Care | Payer: BLUE CROSS/BLUE SHIELD | Source: Ambulatory Visit | Attending: Oncology | Admitting: Oncology

## 2016-09-21 DIAGNOSIS — Z853 Personal history of malignant neoplasm of breast: Secondary | ICD-10-CM

## 2016-11-17 ENCOUNTER — Other Ambulatory Visit: Payer: Self-pay | Admitting: Oncology

## 2016-11-17 NOTE — Progress Notes (Unsigned)
he

## 2016-11-30 ENCOUNTER — Other Ambulatory Visit: Payer: Self-pay | Admitting: *Deleted

## 2016-11-30 DIAGNOSIS — C50111 Malignant neoplasm of central portion of right female breast: Secondary | ICD-10-CM

## 2016-12-03 ENCOUNTER — Other Ambulatory Visit (HOSPITAL_BASED_OUTPATIENT_CLINIC_OR_DEPARTMENT_OTHER): Payer: BLUE CROSS/BLUE SHIELD

## 2016-12-03 DIAGNOSIS — C50111 Malignant neoplasm of central portion of right female breast: Secondary | ICD-10-CM | POA: Diagnosis not present

## 2016-12-03 LAB — COMPREHENSIVE METABOLIC PANEL
ALBUMIN: 3.8 g/dL (ref 3.5–5.0)
ALK PHOS: 73 U/L (ref 40–150)
ALT: 18 U/L (ref 0–55)
ANION GAP: 10 meq/L (ref 3–11)
AST: 22 U/L (ref 5–34)
BILIRUBIN TOTAL: 0.44 mg/dL (ref 0.20–1.20)
BUN: 13.9 mg/dL (ref 7.0–26.0)
CO2: 23 mEq/L (ref 22–29)
Calcium: 9.2 mg/dL (ref 8.4–10.4)
Chloride: 107 mEq/L (ref 98–109)
Creatinine: 0.8 mg/dL (ref 0.6–1.1)
EGFR: 80 mL/min/{1.73_m2} — ABNORMAL LOW (ref >90)
Glucose: 95 mg/dl (ref 70–140)
Potassium: 4.5 mEq/L (ref 3.5–5.1)
Sodium: 139 mEq/L (ref 136–145)
TOTAL PROTEIN: 6.9 g/dL (ref 6.4–8.3)

## 2016-12-03 LAB — CBC WITH DIFFERENTIAL/PLATELET
BASO%: 0.4 % (ref 0.0–2.0)
Basophils Absolute: 0 10*3/uL (ref 0.0–0.1)
EOS ABS: 0.2 10*3/uL (ref 0.0–0.5)
EOS%: 2.3 % (ref 0.0–7.0)
HEMATOCRIT: 39.4 % (ref 34.8–46.6)
HEMOGLOBIN: 13.9 g/dL (ref 11.6–15.9)
LYMPH%: 23 % (ref 14.0–49.7)
MCH: 30.6 pg (ref 25.1–34.0)
MCHC: 35.3 g/dL (ref 31.5–36.0)
MCV: 86.8 fL (ref 79.5–101.0)
MONO#: 0.4 10*3/uL (ref 0.1–0.9)
MONO%: 5.2 % (ref 0.0–14.0)
NEUT%: 69.1 % (ref 38.4–76.8)
NEUTROS ABS: 5 10*3/uL (ref 1.5–6.5)
PLATELETS: 257 10*3/uL (ref 145–400)
RBC: 4.54 10*6/uL (ref 3.70–5.45)
RDW: 12.5 % (ref 11.2–14.5)
WBC: 7.3 10*3/uL (ref 3.9–10.3)
lymph#: 1.7 10*3/uL (ref 0.9–3.3)

## 2016-12-05 ENCOUNTER — Ambulatory Visit: Payer: BLUE CROSS/BLUE SHIELD | Admitting: Oncology

## 2016-12-10 ENCOUNTER — Other Ambulatory Visit: Payer: BLUE CROSS/BLUE SHIELD

## 2016-12-10 ENCOUNTER — Ambulatory Visit (HOSPITAL_BASED_OUTPATIENT_CLINIC_OR_DEPARTMENT_OTHER): Payer: BLUE CROSS/BLUE SHIELD | Admitting: Oncology

## 2016-12-10 VITALS — BP 128/75 | HR 64 | Temp 97.5°F | Resp 20 | Ht 66.0 in | Wt 171.5 lb

## 2016-12-10 DIAGNOSIS — Z79810 Long term (current) use of selective estrogen receptor modulators (SERMs): Secondary | ICD-10-CM | POA: Diagnosis not present

## 2016-12-10 DIAGNOSIS — C50111 Malignant neoplasm of central portion of right female breast: Secondary | ICD-10-CM

## 2016-12-10 DIAGNOSIS — Z170 Estrogen receptor positive status [ER+]: Secondary | ICD-10-CM | POA: Diagnosis not present

## 2016-12-10 NOTE — Progress Notes (Signed)
[id number]   CSN: [id number]  CC:   Selinda Orion, M.D. Haywood Lasso, M.D. Blair Promise, Ph.D., M.D. Herma Mering, MD Berneta Sages, M.D.  CHIEF COMPLAINT: Estrogen receptor positive breast cancer  CURRENT TREATMENT: Completed 5 years of Tamoxifen  BREAST CANCER HISTORY: From the original intake note:  The patient had an unremarkable screening mammogram in June of 2011 at the [facility].  However, she noted what looked to her like nipple inversion.  She brought this to Dr. Danna Hefty attention and he set her up for diagnostic mammography at the [facility], July 09, 2011.  This showed a heterogeneously dense breast pattern and new mild inversion of the right nipple.  There was also subtle distortion in the lateral subareolar portion of the right breast. Mammography also showed a small nodule medially within the right breast which was an interval change.  The left breast was unremarkable.  On physical exam, Dr. Glennon Mac noted the nipple inversion but no palpable mass and no palpable right axillary adenopathy.  Ultrasonography showed an irregularly marginated hypoechoic mass within the subareolar portion of the right breast at the 9 o'clock position measuring 1.3 cm.  There was also an intramammary lymph node with asymmetrical cortical thickening at the 4 o'clock position in the right breast.  This measured 6 mm.  The ultrasound of the axilla was unremarkable. With this information, ultrasound-guided core biopsy of the right breast mass was performed.  On the initial pass, the mass collapsed consistent with a complicated cyst.  The pathology from this procedure (SAA [id number]) showed an invasive mammary carcinoma, grade 1 which was strongly estrogen and progesterone receptor positive at 99% and 100% respectively.  The MIB-1 was 3%.  There was no amplification of HER-2 by CISH, the ratio being 1.43. Bilateral breast MRIs were obtained July 13, 2011.  This again showed the right lateral  sub areolar mass measuring 1.5 cm.  The other area of the right breast, which had also been previously biopsied, had been shown to be benign. The patient proceeded to definitive Right lumpectomy and sentinel lymph node sampling August 15, 2011. Her subsequent history is as detailed below.  INTERVAL HISTORY: Arva returns today for follow-up of her estrogen receptor positive breast cancer. She completes 5 years of tamoxifen this month. She has tolerated it well, except for problems with hot flashes and more importantly vaginal dryness issues, which may or may not be related to the tamoxifen. She obtains it at a good price  REVIEW OF SYSTEMS: Zaiya is exercising approximately 3 times a week but does not like her weight and she blames this on her and her husband diet. Aside from this the main issue is vaginal dryness, and she is not using vaginal estrogen suppositories because of cost. She cannot use the Estring she says because it slips out. A detailed review of systems today was otherwise noncontributory   Past Medical History:  Diagnosis Date  . Anxiety   . Asthma   . Breast cancer, IDC, Right, ER+,PR+, HER2- 07/10/2011  . Chronic back pain   . Degenerative lumbar disc   . Migraine    Past Surgical History:  Procedure Laterality Date  . ABDOMINAL HYSTERECTOMY     and rectocele repair  . BLADDER SUSPENSION    . BREAST LUMPECTOMY  08/15/2011   Procedure: BREAST LUMPECTOMY WITH EXCISION OF SENTINEL NODE;  Surgeon: Haywood Lasso, MD;  Location: [facility];  Service: General;  Laterality: Right;  .  ENDOMETRIAL ABLATION    . MASTECTOMY PARTIAL / LUMPECTOMY  [id number]   right central with SLN - Dr Margot Chimes, right     FAMILY HISTORY: Family History  Problem Relation Age of Onset  . Liver cancer Father   . Pancreatic cancer Paternal Uncle   . Multiple myeloma Paternal Grandmother   . Stroke Mother   . Cirrhosis Father   . Colon polyps Father   . Heart disease Mother    . Diabetes Mother   . Diabetes Father     The patient's father died at age 55 with hepatocellular carcinoma secondary to cryptogenic cirrhosis.  The patient's mother died at age 65.  Ione has 1 brother, no sisters.  There is no history of breast or ovarian cancer in the family.   GYNECOLOGIC HISTORY:  She had menarche age 59.  Status post simple hysterectomy in 2012 She is GX, P3, 1st pregnancy to term at age 75, which she understands is a risk factor for breast cancer.  The patient used oral contraceptives for approximately 5 years remotely.  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  Her husband goes by "Barnabas Lister".  Their sons are Caryl Comes, and Clarise Cruz.   HEALTH MAINTENANCE:  The patient smoked minimally, perhaps as much as 5- pack-years.  She quit in 1995.  She does not use alcohol.  She had a colonoscopy in October of 2011, a bone density in 2010 which was normal (there is a copy in the chart), most recent Pap smear September of 2011.  ALLERGIES:  No known drug allergies.  MEDICATIONS:    Current Outpatient Prescriptions on File Prior to Visit  Medication Sig Dispense Refill  . ALPRAZolam (XANAX) 0.5 MG tablet Take 0.5 mg by mouth at bedtime as needed.      . naproxen sodium (ANAPROX) 220 MG tablet Take 220 mg by mouth as needed. Reported on 02/01/2016    . Omega-3 Fatty Acids (FISH OIL) 1000 MG CAPS Take by mouth.    . SUMAtriptan (IMITREX) 100 MG tablet Take 100 mg by mouth as needed. Reported on 02/01/2016    . traZODone (DESYREL) 100 MG tablet Take 100 mg by mouth at bedtime.       No current facility-administered medications on file prior to visit.    PHYSICAL EXAM:  middle-aged white woman In no acute distress  Vitals:   12/10/16 1118  BP: 128/75  Pulse: 64  Resp: 20  Temp: 97.5 F (36.4 C)    Sclerae unicteric, pupils round and equal Oropharynx clear and moist No cervical or supraclavicular  adenopathy Lungs no rales or rhonchi Heart regular rate and rhythm Abd soft, nontender, positive bowel sounds MSK no focal spinal tenderness, no upper extremity lymphedema Neuro: nonfocal, well oriented, appropriate affect Breasts: The right breast is status post central lumpectomy and radiation with no evidence of local recurrence. Left breast is unremarkable. Both axillae are benign   LABS:  CMP Latest Ref Rng & Units 12/03/2016 01/25/2016 01/24/2015  Glucose 70 - 140 mg/dl 95 68(L) 98  BUN 7.0 - 26.0 mg/dL 13.9 12.4 8.9  Creatinine 0.6 - 1.1 mg/dL 0.8 0.9 0.8  Sodium 136 - 145 mEq/L 139 138 142  Potassium 3.5 - 5.1 mEq/L 4.5 4.1 4.9  Chloride 98 - 107 mEq/L - - -  CO2 22 - 29 mEq/L '23 27 22  ' Calcium 8.4 - 10.4 mg/dL 9.2 9.0 8.6  Total Protein 6.4 - 8.3 g/dL 6.9 6.8 6.3(L)  Total Bilirubin 0.20 - 1.20 mg/dL 0.44 0.61 0.42  Alkaline Phos 40 - 150 U/L 73 66 62  AST 5 - 34 U/L '22 25 20  ' ALT 0 - 55 U/L '18 16 15     ' CBC Latest Ref Rng & Units 12/03/2016 01/25/2016 01/24/2015  WBC 3.9 - 10.3 10e3/uL 7.3 6.0 5.6  Hemoglobin 11.6 - 15.9 g/dL 13.9 14.0 13.8  Hematocrit 34.8 - 46.6 % 39.4 42.0 40.2  Platelets 145 - 400 10e3/uL 257 260 246    STUDIES: Mammography at the [facility] 09/21/2016 found the breast density to be category C. There was no evidence of malignancy.  IMPRESSION:  61 y.o.  [place] woman status post right central lumpectomy November 2012 for a T1c N0 (Stage IA) invasive ductal carcinoma, grade 1, strongly estrogen and progesterone receptor positive, with no HER-2 amplification, and an MIB-1 of 3%;   (1) the patient's ONCOTYPE DX reports a recurrence score of 7, predicting a risk of distant recurrence of 6% if her only systemic treatment is tamoxifen for 5 years;   (2) she completed radiation March 2013 at which point she started tamoxifen.  (3)status post simple hysterectomy in 2012  PLAN:  Vinessa is now 5-1/2 years out from definitive surgery for her breast  cancer with no evidence of disease recurrence. This is very favorable.  She completes 5 years on tamoxifen today. In patients who are node positive or otherwise high risk or suggesting continuing tamoxifen for a longer period and certainly that would be an option for her however the risk reduction would be minimal and specifically the risk reduction in terms of outside the breast recurrence (bone, lungs, liver) would be in the 1% range.  Accordingly we are stopping tamoxifen at this point. This raises the question of whether she can safely use vaginal estrogens for the vaginal dryness issue. We reviewed the data we have on women with do not have a history of breast cancer and even vaginal estrogens have been shown in retrospective studies to increase the risk of breast cancer developing as compared to women who do not use estrogens, but the risk increase is small, and the data of courses not definitive as being studies were not randomized placebo control. We have very little data in women with a history of breast cancer. The 2 studies, both very small, that randomized women in this group came up with conflicting results. to no data in  My feeling is that if she really needs to use vaginal estrogens and that is the only way to control her symptoms and this is a quality of life issue as she would needs to be willing to accept a small risk that she might develop another breast cancer, but on the other hand she has essentially cut the risk in half by taking anti-estrogens for 5 years  At this point I feel comfortable releasing her to her primary care physician. All she will need in terms of breast cancer follow-up is yearly mammography and a yearly physician breast exam.  I will be glad to see her again at any point in the future but as of now are making no further routine appointment for her here. Chauncey Cruel, MD 12/10/16

## 2017-07-19 DIAGNOSIS — Z23 Encounter for immunization: Secondary | ICD-10-CM | POA: Diagnosis not present

## 2017-08-02 DIAGNOSIS — D2271 Melanocytic nevi of right lower limb, including hip: Secondary | ICD-10-CM | POA: Diagnosis not present

## 2017-08-02 DIAGNOSIS — L570 Actinic keratosis: Secondary | ICD-10-CM | POA: Diagnosis not present

## 2017-08-02 DIAGNOSIS — L819 Disorder of pigmentation, unspecified: Secondary | ICD-10-CM | POA: Diagnosis not present

## 2017-08-02 DIAGNOSIS — Z85828 Personal history of other malignant neoplasm of skin: Secondary | ICD-10-CM | POA: Diagnosis not present

## 2017-08-15 ENCOUNTER — Ambulatory Visit: Payer: 59 | Admitting: Sports Medicine

## 2017-08-15 VITALS — BP 122/82 | Ht 65.5 in | Wt 165.0 lb

## 2017-08-15 DIAGNOSIS — M25559 Pain in unspecified hip: Secondary | ICD-10-CM

## 2017-08-15 NOTE — Progress Notes (Signed)
   [facility] Phone: [phone]  Subjective:  Stephanie Malone is a 61 year old female presenting to clinic with bilateral hip and knee pain. She feels like the pain starts on the outside of her hips and then radiates down to the outside of her knees. The pain is "achy" and worse at night. She also sometimes notices the pain when she goes upstairs. She will occasionally take aleve for this. She currently does exercise classes three times per week (either an exercise class on a surfboard or a HIIT class) and will walk once a week. She denies any popping, locking, or giving out of her hips or knees.  ROS: See HPI for pertinent positives and negatives  Objective: BP 122/82   Ht 5' 5.5" (1.664 m)   Wt 165 lb (74.8 kg)   BMI 27.04 kg/m  Gen: NAD, alert, cooperative with exam Hips: +tenderness to palpation over the posterior aspect of the greater trochanter bilaterally, mildly decreased external rotation of the right hip compared to the left, significant hip abductor weakness bilaterally, good quad strength bilaterally. Knees: No erythema, edema, or gross deformity. Normal ROM. No crepitus. +tenderness to palpation over distal portion of the IT band and the lateral condyle of the tibia bilaterally. Valgus and varus stress tests negative.  Neuro: Distal lower extremities are neurovascularly intact.  Assessment/Plan: Greater Trochanteric Pain Syndrome: Patient with bilateral lateral hip pain radiating down to the lateral knees. She has been doing an surfboard exercise class, which is likely contributing due to repeated side to side hip motion. Likely also with some IT band tightness. Patient has significant hip abductor weakness on both sides. - Home rehabilitation exercises for hip abductor strengthening - Can use aleve as needed for pain - Follow-up in 2 months   Hyman Bible, MD PGY-3 I observed and examined the patient with the resident and agree with assessment and plan.  Note  reviewed and modified by me. Stefanie Libel, MD

## 2017-08-15 NOTE — Assessment & Plan Note (Signed)
Patient with bilateral lateral hip pain radiating down to the lateral knees. She has been doing an surfboard exercise class, which is likely contributing due to repeated side to side hip motion. Likely also with some IT band tightness. Patient has significant hip abductor weakness on both sides. - Home rehabilitation exercises for hip abductor strengthening - Can use aleve as needed for pain - Follow-up in 2 months

## 2017-08-16 ENCOUNTER — Encounter: Payer: Self-pay | Admitting: Sports Medicine

## 2017-08-20 ENCOUNTER — Other Ambulatory Visit: Payer: Self-pay | Admitting: Internal Medicine

## 2017-08-20 ENCOUNTER — Other Ambulatory Visit: Payer: Self-pay | Admitting: Oncology

## 2017-08-20 DIAGNOSIS — Z853 Personal history of malignant neoplasm of breast: Secondary | ICD-10-CM

## 2017-09-20 ENCOUNTER — Other Ambulatory Visit: Payer: Self-pay | Admitting: Oncology

## 2017-09-20 DIAGNOSIS — Z853 Personal history of malignant neoplasm of breast: Secondary | ICD-10-CM

## 2017-09-23 ENCOUNTER — Ambulatory Visit
Admission: RE | Admit: 2017-09-23 | Discharge: 2017-09-23 | Disposition: A | Discharge status: Home / Self Care | Payer: 59 | Source: Ambulatory Visit | Attending: Internal Medicine | Admitting: Internal Medicine

## 2017-09-23 DIAGNOSIS — R922 Inconclusive mammogram: Secondary | ICD-10-CM | POA: Diagnosis not present

## 2017-09-23 DIAGNOSIS — Z853 Personal history of malignant neoplasm of breast: Secondary | ICD-10-CM

## 2017-09-23 HISTORY — DX: Personal history of irradiation: Z92.3

## 2017-11-25 DIAGNOSIS — R82998 Other abnormal findings in urine: Secondary | ICD-10-CM | POA: Diagnosis not present

## 2017-11-25 DIAGNOSIS — Z0000 Encounter for general adult medical examination without abnormal findings: Secondary | ICD-10-CM | POA: Diagnosis not present

## 2017-12-02 DIAGNOSIS — C50919 Malignant neoplasm of unspecified site of unspecified female breast: Secondary | ICD-10-CM | POA: Diagnosis not present

## 2017-12-02 DIAGNOSIS — Z1389 Encounter for screening for other disorder: Secondary | ICD-10-CM | POA: Diagnosis not present

## 2017-12-02 DIAGNOSIS — Z0000 Encounter for general adult medical examination without abnormal findings: Secondary | ICD-10-CM | POA: Diagnosis not present

## 2017-12-02 DIAGNOSIS — C439 Malignant melanoma of skin, unspecified: Secondary | ICD-10-CM | POA: Diagnosis not present

## 2017-12-05 ENCOUNTER — Encounter: Payer: Self-pay | Admitting: Sports Medicine

## 2017-12-13 DIAGNOSIS — Z1212 Encounter for screening for malignant neoplasm of rectum: Secondary | ICD-10-CM | POA: Diagnosis not present

## 2017-12-18 DIAGNOSIS — N390 Urinary tract infection, site not specified: Secondary | ICD-10-CM | POA: Diagnosis not present

## 2017-12-19 DIAGNOSIS — Z01419 Encounter for gynecological examination (general) (routine) without abnormal findings: Secondary | ICD-10-CM | POA: Diagnosis not present

## 2017-12-19 DIAGNOSIS — Z124 Encounter for screening for malignant neoplasm of cervix: Secondary | ICD-10-CM | POA: Diagnosis not present

## 2018-01-31 DIAGNOSIS — L738 Other specified follicular disorders: Secondary | ICD-10-CM | POA: Diagnosis not present

## 2018-01-31 DIAGNOSIS — L821 Other seborrheic keratosis: Secondary | ICD-10-CM | POA: Diagnosis not present

## 2018-01-31 DIAGNOSIS — Z85828 Personal history of other malignant neoplasm of skin: Secondary | ICD-10-CM | POA: Diagnosis not present

## 2018-02-13 DIAGNOSIS — M545 Low back pain: Secondary | ICD-10-CM | POA: Diagnosis not present

## 2018-02-13 DIAGNOSIS — M25559 Pain in unspecified hip: Secondary | ICD-10-CM | POA: Diagnosis not present

## 2018-02-17 DIAGNOSIS — M545 Low back pain: Secondary | ICD-10-CM | POA: Diagnosis not present

## 2018-02-17 DIAGNOSIS — M25559 Pain in unspecified hip: Secondary | ICD-10-CM | POA: Diagnosis not present

## 2018-02-24 DIAGNOSIS — M545 Low back pain: Secondary | ICD-10-CM | POA: Diagnosis not present

## 2018-02-24 DIAGNOSIS — M25559 Pain in unspecified hip: Secondary | ICD-10-CM | POA: Diagnosis not present

## 2018-08-01 DIAGNOSIS — L738 Other specified follicular disorders: Secondary | ICD-10-CM | POA: Diagnosis not present

## 2018-08-01 DIAGNOSIS — Z85828 Personal history of other malignant neoplasm of skin: Secondary | ICD-10-CM | POA: Diagnosis not present

## 2018-08-01 DIAGNOSIS — L821 Other seborrheic keratosis: Secondary | ICD-10-CM | POA: Diagnosis not present

## 2018-09-01 ENCOUNTER — Ambulatory Visit: Payer: 59 | Admitting: Sports Medicine

## 2018-09-01 ENCOUNTER — Encounter: Payer: Self-pay | Admitting: Sports Medicine

## 2018-09-01 VITALS — BP 119/73 | Ht 65.5 in | Wt 175.0 lb

## 2018-09-01 DIAGNOSIS — S86811A Strain of other muscle(s) and tendon(s) at lower leg level, right leg, initial encounter: Secondary | ICD-10-CM

## 2018-09-01 NOTE — Progress Notes (Signed)
CC: L knee pain, R calf pain   HPI  Left knee pain: This is somewhat chronic for her, she says she has had pain off and on for several years.  3 months ago she missed a step and fell on both knees and hands on concrete, and feels that her pain is been more frequent after this.  She has no pain at rest.  She occasionally uses ibuprofen, and also has used dry needling to the quad in the past with physical therapy, which she says has significantly decreased the flares in her left knee pain.  She also was previously doing yoga.  She notes the pain most with squats or going up stairs over the medial aspect of her knee.  No history of surgery, she cannot recall her diagnosis when previously assessed for this knee at our office.  Right calf pain: She has a history of a tear of her left soleus 5 years ago while running.  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  This is a very similar presentation to her previous calf tear.  She had pain immediately afterwards while walking.  She did not have pain with flexion or extension of the foot, but felt she could not walk on her toes and pain was worse with calf raises at home.  She feels significantly improved with ice over the weekend.  CC, SH/smoking status, and VS noted  Objective: BP 119/73   Ht 5' 5.5" (1.664 m)   Wt 175 lb (79.4 kg)   BMI 28.68 kg/m  Gen: NAD, alert, cooperative, and pleasant. L knee: Mildly full over the medial aspect of the joint line, but no swelling palpated.  No effusion in the suprapatellar region, ligaments intact to anterior and posterior drawer varus and valgus testing.  No pain over medial or lateral joint line or posterior knee pain.  No pain with squats. Right calf: No effusion or bruising or swelling, no pain with palpation of the muscle body of the soleus, some pain over the medial  aspect of the gastroc.  Able to do a calf raise on the right leg without significant pain. Neuro: Alert and oriented, Speech clear, No gross deficits  Limited bedside ultrasound was performed of the left knee: No suprapatellar effusion, small calcification of the medial meniscus with mild bulging from the joint, no other abnormalities visualized  Limited bedside ultrasound of the right calf: Some change in direction of muscle fibers with some mild edema surrounding the gastroc at the intersection of the gastroc and soleus and mid calf.  That is the location of pain.  Assessment and plan:  Left knee pain: Likely due to osteoarthritic changes, continue exercise as able, continue dry needling if helpful, continue ice and ibuprofen as needed.  Right calf strain: Likely partial tear of the right gastroc, patient was given bilateral heel lifts 5/16, wear her compression sleeve that she had from previous injury, can swim, bike, trial exercises at reduced intensity in 1 week, and return to play as pain tolerates.  Once she is completely improved, she was given stretches and strengthening exercises of the gastroc soleus to perform to hopefully avoid future injury.  Return in 3 weeks for recheck.   Ralene Ok, MD, PGY3 09/01/2018 12:19 PM   Patient seen and evaluated with the resident.  I agree  with the above plan of care.  Ultrasound diagnosis of the patient's left knee shows a bulging medial meniscus but no significant spurring.  No effusion.  Ultrasound evaluation of her right calf does show some disorganization of the gastrocnemius muscle fibers at the junction of the soleus.  Findings are consistent with a strain.  No obvious hematoma.  Treatment as above.  Follow-up in 3 weeks if still symptomatic.

## 2018-09-22 ENCOUNTER — Ambulatory Visit: Payer: 59 | Admitting: Sports Medicine

## 2018-10-07 ENCOUNTER — Other Ambulatory Visit: Payer: Self-pay | Admitting: Internal Medicine

## 2018-10-07 DIAGNOSIS — Z853 Personal history of malignant neoplasm of breast: Secondary | ICD-10-CM

## 2018-10-22 ENCOUNTER — Other Ambulatory Visit: Payer: Self-pay | Admitting: Internal Medicine

## 2018-10-22 ENCOUNTER — Ambulatory Visit
Admission: RE | Admit: 2018-10-22 | Discharge: 2018-10-22 | Disposition: A | Discharge status: Home / Self Care | Payer: 59 | Source: Ambulatory Visit | Attending: Internal Medicine | Admitting: Internal Medicine

## 2018-10-22 DIAGNOSIS — Z853 Personal history of malignant neoplasm of breast: Secondary | ICD-10-CM

## 2018-10-22 DIAGNOSIS — Z1231 Encounter for screening mammogram for malignant neoplasm of breast: Secondary | ICD-10-CM | POA: Diagnosis not present

## 2018-11-26 DIAGNOSIS — R1310 Dysphagia, unspecified: Secondary | ICD-10-CM | POA: Diagnosis not present

## 2018-11-26 DIAGNOSIS — K224 Dyskinesia of esophagus: Secondary | ICD-10-CM | POA: Diagnosis not present

## 2018-12-11 DIAGNOSIS — C50919 Malignant neoplasm of unspecified site of unspecified female breast: Secondary | ICD-10-CM | POA: Diagnosis not present

## 2018-12-11 DIAGNOSIS — G43909 Migraine, unspecified, not intractable, without status migrainosus: Secondary | ICD-10-CM | POA: Diagnosis not present

## 2018-12-11 DIAGNOSIS — C439 Malignant melanoma of skin, unspecified: Secondary | ICD-10-CM | POA: Diagnosis not present

## 2018-12-11 DIAGNOSIS — R82998 Other abnormal findings in urine: Secondary | ICD-10-CM | POA: Diagnosis not present

## 2018-12-11 DIAGNOSIS — Z0000 Encounter for general adult medical examination without abnormal findings: Secondary | ICD-10-CM | POA: Diagnosis not present

## 2019-02-26 DIAGNOSIS — Z20828 Contact with and (suspected) exposure to other viral communicable diseases: Secondary | ICD-10-CM | POA: Diagnosis not present

## 2019-06-17 DIAGNOSIS — H43391 Other vitreous opacities, right eye: Secondary | ICD-10-CM | POA: Diagnosis not present

## 2019-06-17 DIAGNOSIS — H43813 Vitreous degeneration, bilateral: Secondary | ICD-10-CM | POA: Diagnosis not present

## 2019-06-17 DIAGNOSIS — H35431 Paving stone degeneration of retina, right eye: Secondary | ICD-10-CM | POA: Diagnosis not present

## 2019-06-17 DIAGNOSIS — H2513 Age-related nuclear cataract, bilateral: Secondary | ICD-10-CM | POA: Diagnosis not present

## 2019-08-12 DIAGNOSIS — Z20828 Contact with and (suspected) exposure to other viral communicable diseases: Secondary | ICD-10-CM | POA: Diagnosis not present

## 2019-08-12 DIAGNOSIS — Z9189 Other specified personal risk factors, not elsewhere classified: Secondary | ICD-10-CM | POA: Diagnosis not present

## 2019-11-04 ENCOUNTER — Other Ambulatory Visit: Payer: Self-pay | Admitting: Internal Medicine

## 2019-12-10 DIAGNOSIS — N390 Urinary tract infection, site not specified: Secondary | ICD-10-CM | POA: Diagnosis not present

## 2019-12-16 DIAGNOSIS — R7989 Other specified abnormal findings of blood chemistry: Secondary | ICD-10-CM | POA: Diagnosis not present

## 2019-12-16 DIAGNOSIS — Z0000 Encounter for general adult medical examination without abnormal findings: Secondary | ICD-10-CM | POA: Diagnosis not present

## 2019-12-16 DIAGNOSIS — Z79899 Other long term (current) drug therapy: Secondary | ICD-10-CM | POA: Diagnosis not present

## 2019-12-23 DIAGNOSIS — Z1331 Encounter for screening for depression: Secondary | ICD-10-CM | POA: Diagnosis not present

## 2019-12-23 DIAGNOSIS — C439 Malignant melanoma of skin, unspecified: Secondary | ICD-10-CM | POA: Diagnosis not present

## 2019-12-23 DIAGNOSIS — Z0000 Encounter for general adult medical examination without abnormal findings: Secondary | ICD-10-CM | POA: Diagnosis not present

## 2019-12-23 DIAGNOSIS — R82998 Other abnormal findings in urine: Secondary | ICD-10-CM | POA: Diagnosis not present

## 2019-12-23 DIAGNOSIS — C50919 Malignant neoplasm of unspecified site of unspecified female breast: Secondary | ICD-10-CM | POA: Diagnosis not present

## 2019-12-23 DIAGNOSIS — F39 Unspecified mood [affective] disorder: Secondary | ICD-10-CM | POA: Diagnosis not present

## 2019-12-23 DIAGNOSIS — G43909 Migraine, unspecified, not intractable, without status migrainosus: Secondary | ICD-10-CM | POA: Diagnosis not present

## 2020-02-04 DIAGNOSIS — Z1231 Encounter for screening mammogram for malignant neoplasm of breast: Secondary | ICD-10-CM | POA: Diagnosis not present

## 2020-05-03 DIAGNOSIS — Z1211 Encounter for screening for malignant neoplasm of colon: Secondary | ICD-10-CM | POA: Diagnosis not present

## 2020-05-03 DIAGNOSIS — R1310 Dysphagia, unspecified: Secondary | ICD-10-CM | POA: Diagnosis not present

## 2020-05-03 DIAGNOSIS — K5900 Constipation, unspecified: Secondary | ICD-10-CM | POA: Diagnosis not present

## 2020-05-12 DIAGNOSIS — Z20828 Contact with and (suspected) exposure to other viral communicable diseases: Secondary | ICD-10-CM | POA: Diagnosis not present

## 2020-05-24 DIAGNOSIS — L988 Other specified disorders of the skin and subcutaneous tissue: Secondary | ICD-10-CM | POA: Diagnosis not present

## 2020-05-25 DIAGNOSIS — T148XXA Other injury of unspecified body region, initial encounter: Secondary | ICD-10-CM | POA: Diagnosis not present

## 2020-05-25 DIAGNOSIS — Y998 Other external cause status: Secondary | ICD-10-CM | POA: Diagnosis not present

## 2020-05-25 DIAGNOSIS — L03119 Cellulitis of unspecified part of limb: Secondary | ICD-10-CM | POA: Diagnosis not present

## 2020-06-07 DIAGNOSIS — D485 Neoplasm of uncertain behavior of skin: Secondary | ICD-10-CM | POA: Diagnosis not present

## 2020-08-17 DIAGNOSIS — Z85828 Personal history of other malignant neoplasm of skin: Secondary | ICD-10-CM | POA: Diagnosis not present

## 2020-08-17 DIAGNOSIS — L738 Other specified follicular disorders: Secondary | ICD-10-CM | POA: Diagnosis not present

## 2020-08-17 DIAGNOSIS — L720 Epidermal cyst: Secondary | ICD-10-CM | POA: Diagnosis not present

## 2020-08-17 DIAGNOSIS — L718 Other rosacea: Secondary | ICD-10-CM | POA: Diagnosis not present

## 2020-08-17 DIAGNOSIS — L814 Other melanin hyperpigmentation: Secondary | ICD-10-CM | POA: Diagnosis not present

## 2020-09-23 DIAGNOSIS — K5730 Diverticulosis of large intestine without perforation or abscess without bleeding: Secondary | ICD-10-CM | POA: Diagnosis not present

## 2020-09-23 DIAGNOSIS — Z8379 Family history of other diseases of the digestive system: Secondary | ICD-10-CM | POA: Diagnosis not present

## 2020-09-23 DIAGNOSIS — K635 Polyp of colon: Secondary | ICD-10-CM | POA: Diagnosis not present

## 2020-09-23 DIAGNOSIS — K449 Diaphragmatic hernia without obstruction or gangrene: Secondary | ICD-10-CM | POA: Diagnosis not present

## 2020-09-23 DIAGNOSIS — K2270 Barrett's esophagus without dysplasia: Secondary | ICD-10-CM | POA: Diagnosis not present

## 2020-09-23 DIAGNOSIS — R1310 Dysphagia, unspecified: Secondary | ICD-10-CM | POA: Diagnosis not present

## 2020-09-23 DIAGNOSIS — Z1211 Encounter for screening for malignant neoplasm of colon: Secondary | ICD-10-CM | POA: Diagnosis not present

## 2020-09-23 DIAGNOSIS — K269 Duodenal ulcer, unspecified as acute or chronic, without hemorrhage or perforation: Secondary | ICD-10-CM | POA: Diagnosis not present

## 2020-09-23 DIAGNOSIS — Z8371 Family history of colonic polyps: Secondary | ICD-10-CM | POA: Diagnosis not present

## 2020-09-23 DIAGNOSIS — K2950 Unspecified chronic gastritis without bleeding: Secondary | ICD-10-CM | POA: Diagnosis not present

## 2020-09-23 DIAGNOSIS — D127 Benign neoplasm of rectosigmoid junction: Secondary | ICD-10-CM | POA: Diagnosis not present

## 2020-09-23 DIAGNOSIS — K640 First degree hemorrhoids: Secondary | ICD-10-CM | POA: Diagnosis not present

## 2020-09-28 DIAGNOSIS — R635 Abnormal weight gain: Secondary | ICD-10-CM | POA: Diagnosis not present

## 2020-09-28 DIAGNOSIS — R948 Abnormal results of function studies of other organs and systems: Secondary | ICD-10-CM | POA: Diagnosis not present

## 2020-10-06 DIAGNOSIS — E781 Pure hyperglyceridemia: Secondary | ICD-10-CM | POA: Diagnosis not present

## 2020-10-06 DIAGNOSIS — E669 Obesity, unspecified: Secondary | ICD-10-CM | POA: Diagnosis not present

## 2020-10-06 DIAGNOSIS — K219 Gastro-esophageal reflux disease without esophagitis: Secondary | ICD-10-CM | POA: Diagnosis not present

## 2020-10-06 DIAGNOSIS — Z6830 Body mass index (BMI) 30.0-30.9, adult: Secondary | ICD-10-CM | POA: Diagnosis not present

## 2020-10-21 DIAGNOSIS — Z713 Dietary counseling and surveillance: Secondary | ICD-10-CM | POA: Diagnosis not present

## 2020-10-21 DIAGNOSIS — E663 Overweight: Secondary | ICD-10-CM | POA: Diagnosis not present

## 2020-10-21 DIAGNOSIS — Z6829 Body mass index (BMI) 29.0-29.9, adult: Secondary | ICD-10-CM | POA: Diagnosis not present

## 2020-10-29 DIAGNOSIS — E663 Overweight: Secondary | ICD-10-CM | POA: Diagnosis not present

## 2020-11-01 DIAGNOSIS — E663 Overweight: Secondary | ICD-10-CM | POA: Diagnosis not present

## 2020-11-01 DIAGNOSIS — K219 Gastro-esophageal reflux disease without esophagitis: Secondary | ICD-10-CM | POA: Diagnosis not present

## 2020-11-01 DIAGNOSIS — E781 Pure hyperglyceridemia: Secondary | ICD-10-CM | POA: Diagnosis not present

## 2020-11-08 DIAGNOSIS — K2270 Barrett's esophagus without dysplasia: Secondary | ICD-10-CM | POA: Diagnosis not present

## 2020-11-08 DIAGNOSIS — R195 Other fecal abnormalities: Secondary | ICD-10-CM | POA: Diagnosis not present

## 2020-11-08 DIAGNOSIS — K2990 Gastroduodenitis, unspecified, without bleeding: Secondary | ICD-10-CM | POA: Diagnosis not present

## 2020-11-08 DIAGNOSIS — R1319 Other dysphagia: Secondary | ICD-10-CM | POA: Diagnosis not present

## 2020-11-22 DIAGNOSIS — E663 Overweight: Secondary | ICD-10-CM | POA: Diagnosis not present

## 2020-11-22 DIAGNOSIS — Z6829 Body mass index (BMI) 29.0-29.9, adult: Secondary | ICD-10-CM | POA: Diagnosis not present

## 2020-11-22 DIAGNOSIS — Z713 Dietary counseling and surveillance: Secondary | ICD-10-CM | POA: Diagnosis not present

## 2020-12-05 DIAGNOSIS — E6609 Other obesity due to excess calories: Secondary | ICD-10-CM | POA: Diagnosis not present

## 2020-12-06 DIAGNOSIS — Z713 Dietary counseling and surveillance: Secondary | ICD-10-CM | POA: Diagnosis not present

## 2020-12-06 DIAGNOSIS — Z6828 Body mass index (BMI) 28.0-28.9, adult: Secondary | ICD-10-CM | POA: Diagnosis not present

## 2020-12-06 DIAGNOSIS — E781 Pure hyperglyceridemia: Secondary | ICD-10-CM | POA: Diagnosis not present

## 2020-12-06 DIAGNOSIS — E663 Overweight: Secondary | ICD-10-CM | POA: Diagnosis not present

## 2020-12-20 DIAGNOSIS — E663 Overweight: Secondary | ICD-10-CM | POA: Diagnosis not present

## 2020-12-20 DIAGNOSIS — Z713 Dietary counseling and surveillance: Secondary | ICD-10-CM | POA: Diagnosis not present

## 2021-01-13 DIAGNOSIS — Z0000 Encounter for general adult medical examination without abnormal findings: Secondary | ICD-10-CM | POA: Diagnosis not present

## 2021-01-17 DIAGNOSIS — E663 Overweight: Secondary | ICD-10-CM | POA: Diagnosis not present

## 2021-01-17 DIAGNOSIS — Z6827 Body mass index (BMI) 27.0-27.9, adult: Secondary | ICD-10-CM | POA: Diagnosis not present

## 2021-01-17 DIAGNOSIS — E781 Pure hyperglyceridemia: Secondary | ICD-10-CM | POA: Diagnosis not present

## 2021-01-20 DIAGNOSIS — Z0000 Encounter for general adult medical examination without abnormal findings: Secondary | ICD-10-CM | POA: Diagnosis not present

## 2021-01-20 DIAGNOSIS — C439 Malignant melanoma of skin, unspecified: Secondary | ICD-10-CM | POA: Diagnosis not present

## 2021-01-25 DIAGNOSIS — Z853 Personal history of malignant neoplasm of breast: Secondary | ICD-10-CM | POA: Diagnosis not present

## 2021-01-25 DIAGNOSIS — Z6826 Body mass index (BMI) 26.0-26.9, adult: Secondary | ICD-10-CM | POA: Diagnosis not present

## 2021-01-25 DIAGNOSIS — Z01419 Encounter for gynecological examination (general) (routine) without abnormal findings: Secondary | ICD-10-CM | POA: Diagnosis not present

## 2021-01-25 DIAGNOSIS — Z131 Encounter for screening for diabetes mellitus: Secondary | ICD-10-CM | POA: Diagnosis not present

## 2021-01-25 DIAGNOSIS — N952 Postmenopausal atrophic vaginitis: Secondary | ICD-10-CM | POA: Diagnosis not present

## 2021-02-16 ENCOUNTER — Other Ambulatory Visit: Payer: Self-pay | Admitting: Obstetrics and Gynecology

## 2021-02-16 DIAGNOSIS — R921 Mammographic calcification found on diagnostic imaging of breast: Secondary | ICD-10-CM

## 2021-03-06 ENCOUNTER — Ambulatory Visit
Admission: RE | Admit: 2021-03-06 | Discharge: 2021-03-06 | Disposition: A | Discharge status: Home / Self Care | Payer: 59 | Source: Ambulatory Visit | Attending: Obstetrics and Gynecology | Admitting: Obstetrics and Gynecology

## 2021-03-06 ENCOUNTER — Other Ambulatory Visit: Payer: Self-pay

## 2021-03-06 ENCOUNTER — Ambulatory Visit: Payer: 59

## 2021-03-06 DIAGNOSIS — R921 Mammographic calcification found on diagnostic imaging of breast: Secondary | ICD-10-CM

## 2021-03-06 DIAGNOSIS — R922 Inconclusive mammogram: Secondary | ICD-10-CM | POA: Diagnosis not present

## 2021-03-06 DIAGNOSIS — R928 Other abnormal and inconclusive findings on diagnostic imaging of breast: Secondary | ICD-10-CM | POA: Diagnosis not present

## 2021-03-23 DIAGNOSIS — Z713 Dietary counseling and surveillance: Secondary | ICD-10-CM | POA: Diagnosis not present

## 2021-03-23 DIAGNOSIS — E781 Pure hyperglyceridemia: Secondary | ICD-10-CM | POA: Diagnosis not present

## 2021-03-23 DIAGNOSIS — E663 Overweight: Secondary | ICD-10-CM | POA: Diagnosis not present

## 2021-03-31 DIAGNOSIS — E663 Overweight: Secondary | ICD-10-CM | POA: Diagnosis not present

## 2021-03-31 DIAGNOSIS — Z713 Dietary counseling and surveillance: Secondary | ICD-10-CM | POA: Diagnosis not present

## 2021-04-17 DIAGNOSIS — Z6827 Body mass index (BMI) 27.0-27.9, adult: Secondary | ICD-10-CM | POA: Diagnosis not present

## 2021-04-17 DIAGNOSIS — Z713 Dietary counseling and surveillance: Secondary | ICD-10-CM | POA: Diagnosis not present

## 2021-04-17 DIAGNOSIS — E663 Overweight: Secondary | ICD-10-CM | POA: Diagnosis not present

## 2021-04-17 DIAGNOSIS — E781 Pure hyperglyceridemia: Secondary | ICD-10-CM | POA: Diagnosis not present

## 2021-06-01 ENCOUNTER — Other Ambulatory Visit: Payer: Self-pay

## 2021-06-01 ENCOUNTER — Ambulatory Visit (INDEPENDENT_AMBULATORY_CARE_PROVIDER_SITE_OTHER): Payer: BC Managed Care – PPO | Admitting: Sports Medicine

## 2021-06-01 VITALS — Ht 65.5 in | Wt 165.0 lb

## 2021-06-01 DIAGNOSIS — G8929 Other chronic pain: Secondary | ICD-10-CM | POA: Diagnosis not present

## 2021-06-01 DIAGNOSIS — M25562 Pain in left knee: Secondary | ICD-10-CM

## 2021-06-01 NOTE — Progress Notes (Addendum)
   Stephanie Malone is a 65 y.o. female who presents to [facility] today for the following:  Left Knee Pain 6 months, off and on Medial and occasionally anterior Squats worsen pain Did a PureBarre class last PM and this AM her knee was very painful Hurts to walk, also aching at rest Took 2 aleve which improved her pain No injuries Reports occasional swelling over the last few months No left hip pain Had [place] in 2016 with Dr. Oneida Alar that showed mild effusion and small bone spurs in lat trochlear groove Has not had any XRs   PMH reviewed.  ROS as above. Medications reviewed.  Exam:  Ht 5' 5.5" (1.664 m)   Wt 165 lb (74.8 kg)   BMI 27.04 kg/m  Gen: Well NAD MSK:  Left Knee: - Inspection: no gross deformity b/l. Very mild effusion of left compared to right, no erythema or bruising b/l. Skin intact - Palpation: mild TTP medial joint line on left, otherwise no TTP b/l - ROM: full active ROM with flexion and extension in knee and hip b/l - Strength: 5/5 strength b/l - Neuro/vasc: NV intact distally b/l - Special Tests: - LIGAMENTS: negative anterior and posterior drawer, negative Lachman's, no MCL or LCL laxity  -- MENISCUS: negative McMurray's -- PF JOINT: nml patellar mobility bilaterally.  negative patellar grind, negative patellar apprehension  Hips: normal ROM, negative FABER and FADIR bilaterally   No results found.   Assessment and Plan: 1) Chronic pain of left knee Suspect that her knee pain is 2/2 OA, as her exam is otherwise reassuring.  She has hip abduction exercises that she does, will also add quad strengthening.  Avoid squats and lunging.  Will obtain baseline XRs.  Can continue ibuprofen prn.  F/U 6 weeks if no improvement. Could consider formal PT, personal trainer, or CSI if severe pain.   [place] Constable, D.O.  PGY-4 [facility]  06/01/2021 4:28 PM   06/07/2021 addendum: X-rays reviewed.  Mild degenerative changes are seen.  There is a small  patellar spur as well as evidence of patella alta.  Patient will be notified of these results via telephone.  Treatment as above.  Follow-up in 6 weeks if still symptomatic.

## 2021-06-01 NOTE — Patient Instructions (Signed)
Thank you for coming to see me today. It was a pleasure. Today we talked about:   I have placed an order for knee x-rays.  Please go to [facility] to have this completed.  You do not need an appointment.  We will contact you with your results afterwards.  Do the exercises that we give you.  Avoid squats.  You can continue to take ibuprofen or aleve as needed.  Please follow-up with [place] in 6 weeks as needed.  If you have any questions or concerns, please do not hesitate to call the office at [phone].  Best,   Ari[place] Constable, DO [place]

## 2021-06-01 NOTE — Assessment & Plan Note (Addendum)
Suspect that her knee pain is 2/2 OA, as her exam is otherwise reassuring.  She has hip abduction exercises that she does, will also add quad strengthening.  Avoid squats and lunging.  Will obtain baseline XRs.  Can continue ibuprofen prn.  F/U 6 weeks if no improvement. Could consider formal PT, personal trainer, or CSI if severe pain.

## 2021-06-05 ENCOUNTER — Ambulatory Visit
Admission: RE | Admit: 2021-06-05 | Discharge: 2021-06-05 | Disposition: A | Discharge status: Home / Self Care | Payer: BC Managed Care – PPO | Source: Ambulatory Visit | Attending: Sports Medicine | Admitting: Sports Medicine

## 2021-06-05 DIAGNOSIS — M25562 Pain in left knee: Secondary | ICD-10-CM

## 2021-06-05 DIAGNOSIS — G8929 Other chronic pain: Secondary | ICD-10-CM

## 2021-10-09 DIAGNOSIS — E781 Pure hyperglyceridemia: Secondary | ICD-10-CM | POA: Diagnosis not present

## 2021-10-09 DIAGNOSIS — Z6830 Body mass index (BMI) 30.0-30.9, adult: Secondary | ICD-10-CM | POA: Diagnosis not present

## 2021-10-09 DIAGNOSIS — E669 Obesity, unspecified: Secondary | ICD-10-CM | POA: Diagnosis not present

## 2021-11-20 DIAGNOSIS — E781 Pure hyperglyceridemia: Secondary | ICD-10-CM | POA: Diagnosis not present

## 2021-11-20 DIAGNOSIS — E669 Obesity, unspecified: Secondary | ICD-10-CM | POA: Diagnosis not present

## 2021-11-20 DIAGNOSIS — Z713 Dietary counseling and surveillance: Secondary | ICD-10-CM | POA: Diagnosis not present

## 2021-12-07 DIAGNOSIS — Z85828 Personal history of other malignant neoplasm of skin: Secondary | ICD-10-CM | POA: Diagnosis not present

## 2021-12-07 DIAGNOSIS — D485 Neoplasm of uncertain behavior of skin: Secondary | ICD-10-CM | POA: Diagnosis not present

## 2021-12-07 DIAGNOSIS — D224 Melanocytic nevi of scalp and neck: Secondary | ICD-10-CM | POA: Diagnosis not present

## 2021-12-07 DIAGNOSIS — D2261 Melanocytic nevi of right upper limb, including shoulder: Secondary | ICD-10-CM | POA: Diagnosis not present

## 2021-12-07 DIAGNOSIS — D2271 Melanocytic nevi of right lower limb, including hip: Secondary | ICD-10-CM | POA: Diagnosis not present

## 2021-12-07 DIAGNOSIS — L738 Other specified follicular disorders: Secondary | ICD-10-CM | POA: Diagnosis not present

## 2021-12-20 DIAGNOSIS — K2270 Barrett's esophagus without dysplasia: Secondary | ICD-10-CM | POA: Diagnosis not present

## 2021-12-20 DIAGNOSIS — R1319 Other dysphagia: Secondary | ICD-10-CM | POA: Diagnosis not present

## 2021-12-20 DIAGNOSIS — R0989 Other specified symptoms and signs involving the circulatory and respiratory systems: Secondary | ICD-10-CM | POA: Diagnosis not present

## 2021-12-20 DIAGNOSIS — K219 Gastro-esophageal reflux disease without esophagitis: Secondary | ICD-10-CM | POA: Diagnosis not present

## 2022-01-22 DIAGNOSIS — R7989 Other specified abnormal findings of blood chemistry: Secondary | ICD-10-CM | POA: Diagnosis not present

## 2022-01-22 DIAGNOSIS — Z0000 Encounter for general adult medical examination without abnormal findings: Secondary | ICD-10-CM | POA: Diagnosis not present

## 2022-01-29 DIAGNOSIS — E785 Hyperlipidemia, unspecified: Secondary | ICD-10-CM | POA: Diagnosis not present

## 2022-01-29 DIAGNOSIS — Z0000 Encounter for general adult medical examination without abnormal findings: Secondary | ICD-10-CM | POA: Diagnosis not present

## 2022-01-29 DIAGNOSIS — C439 Malignant melanoma of skin, unspecified: Secondary | ICD-10-CM | POA: Diagnosis not present

## 2022-01-31 DIAGNOSIS — K3184 Gastroparesis: Secondary | ICD-10-CM | POA: Diagnosis not present

## 2022-01-31 DIAGNOSIS — K2270 Barrett's esophagus without dysplasia: Secondary | ICD-10-CM | POA: Diagnosis not present

## 2022-01-31 DIAGNOSIS — K2100 Gastro-esophageal reflux disease with esophagitis, without bleeding: Secondary | ICD-10-CM | POA: Diagnosis not present

## 2022-01-31 DIAGNOSIS — R1310 Dysphagia, unspecified: Secondary | ICD-10-CM | POA: Diagnosis not present

## 2022-01-31 DIAGNOSIS — Z8719 Personal history of other diseases of the digestive system: Secondary | ICD-10-CM | POA: Diagnosis not present

## 2022-02-01 DIAGNOSIS — N952 Postmenopausal atrophic vaginitis: Secondary | ICD-10-CM | POA: Diagnosis not present

## 2022-02-01 DIAGNOSIS — Z130 Encounter for screening for diseases of the blood and blood-forming organs and certain disorders involving the immune mechanism: Secondary | ICD-10-CM | POA: Diagnosis not present

## 2022-02-01 DIAGNOSIS — Z1389 Encounter for screening for other disorder: Secondary | ICD-10-CM | POA: Diagnosis not present

## 2022-02-01 DIAGNOSIS — Z01411 Encounter for gynecological examination (general) (routine) with abnormal findings: Secondary | ICD-10-CM | POA: Diagnosis not present

## 2022-02-20 DIAGNOSIS — Z79899 Other long term (current) drug therapy: Secondary | ICD-10-CM | POA: Diagnosis not present

## 2022-02-20 DIAGNOSIS — E663 Overweight: Secondary | ICD-10-CM | POA: Diagnosis not present

## 2022-02-20 DIAGNOSIS — E781 Pure hyperglyceridemia: Secondary | ICD-10-CM | POA: Diagnosis not present

## 2022-02-20 DIAGNOSIS — Z713 Dietary counseling and surveillance: Secondary | ICD-10-CM | POA: Diagnosis not present

## 2022-05-25 ENCOUNTER — Other Ambulatory Visit (HOSPITAL_COMMUNITY): Payer: Self-pay

## 2022-05-25 MED ORDER — WEGOVY 1 MG/0.5ML ~~LOC~~ SOAJ
1.0000 mg | SUBCUTANEOUS | 2 refills | Status: DC
  Filled 2022-05-25 – 2022-05-28 (×2): qty 2, 28d supply, fill #0
  Filled 2022-06-25 – 2022-07-03 (×3): qty 2, 28d supply, fill #1

## 2022-05-28 ENCOUNTER — Other Ambulatory Visit (HOSPITAL_COMMUNITY): Payer: Self-pay

## 2022-06-25 ENCOUNTER — Other Ambulatory Visit (HOSPITAL_COMMUNITY): Payer: Self-pay

## 2022-06-27 ENCOUNTER — Other Ambulatory Visit (HOSPITAL_COMMUNITY): Payer: Self-pay

## 2022-07-02 ENCOUNTER — Other Ambulatory Visit (HOSPITAL_COMMUNITY): Payer: Self-pay

## 2022-07-03 ENCOUNTER — Other Ambulatory Visit (HOSPITAL_COMMUNITY): Payer: Self-pay

## 2022-07-17 DIAGNOSIS — Z713 Dietary counseling and surveillance: Secondary | ICD-10-CM | POA: Diagnosis not present

## 2022-07-17 DIAGNOSIS — Z79899 Other long term (current) drug therapy: Secondary | ICD-10-CM | POA: Diagnosis not present

## 2022-07-17 DIAGNOSIS — E663 Overweight: Secondary | ICD-10-CM | POA: Diagnosis not present

## 2022-07-17 DIAGNOSIS — E781 Pure hyperglyceridemia: Secondary | ICD-10-CM | POA: Diagnosis not present

## 2022-08-27 DIAGNOSIS — K2270 Barrett's esophagus without dysplasia: Secondary | ICD-10-CM | POA: Diagnosis not present

## 2022-08-27 DIAGNOSIS — K219 Gastro-esophageal reflux disease without esophagitis: Secondary | ICD-10-CM | POA: Diagnosis not present

## 2022-08-27 DIAGNOSIS — R198 Other specified symptoms and signs involving the digestive system and abdomen: Secondary | ICD-10-CM | POA: Diagnosis not present

## 2022-10-10 IMAGING — MG MM DIGITAL DIAGNOSTIC UNILAT*R* W/ TOMO W/ CAD
6 series · 6 of 10 positions shown · non-contrast
Comparison: Previous exam(s).

CLINICAL DATA: Screening recall for right breast calcifications.

EXAM:
DIGITAL DIAGNOSTIC UNILATERAL RIGHT MAMMOGRAM WITH TOMOSYNTHESIS AND
CAD
TECHNIQUE: Right digital diagnostic mammography and breast tomosynthesis was
performed. The images were evaluated with computer-aided detection.

[R CC]
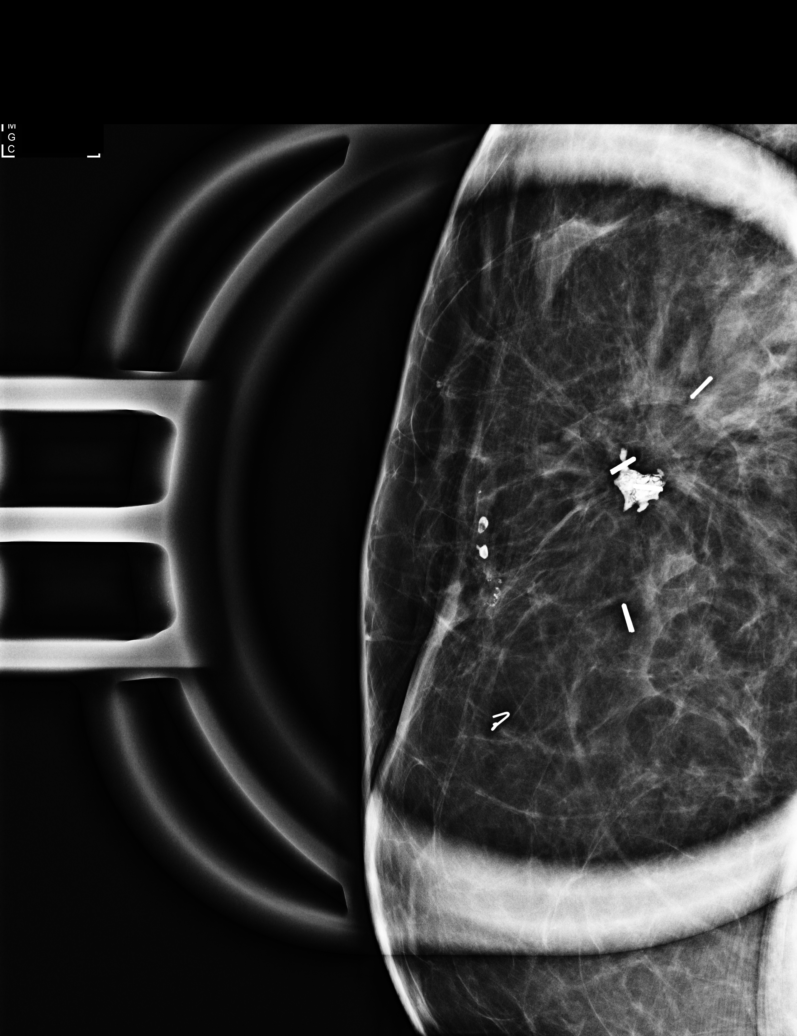

[R ML (1 of 3)]
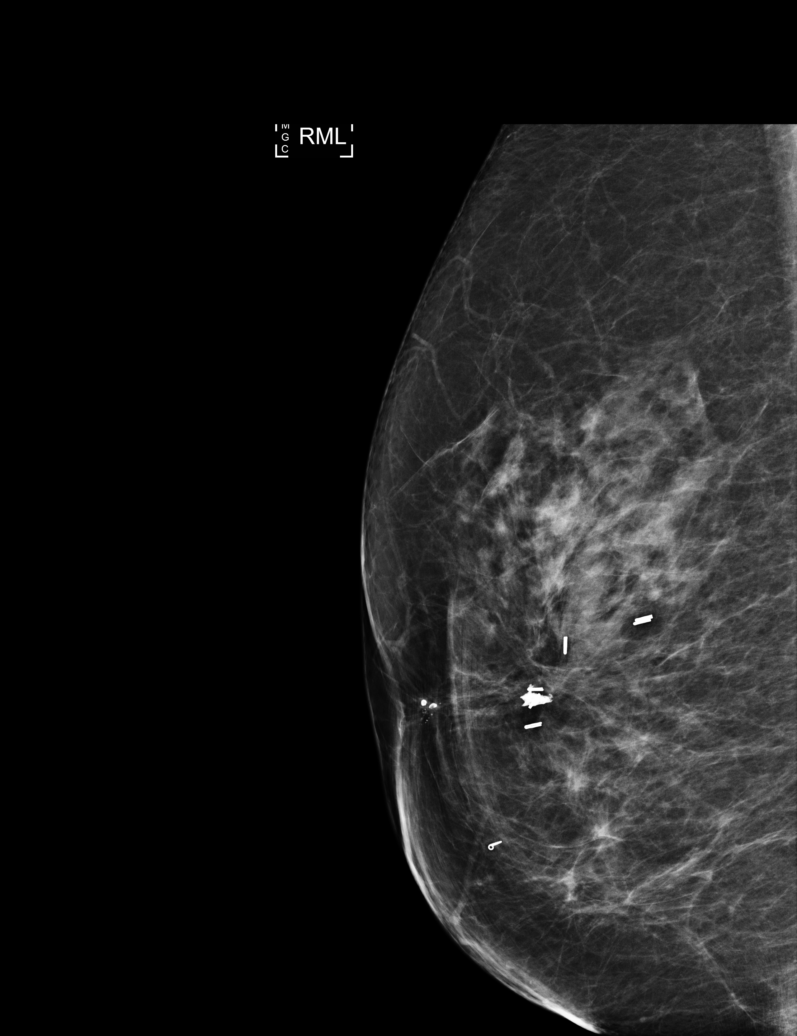

[R ML (2 of 3)]
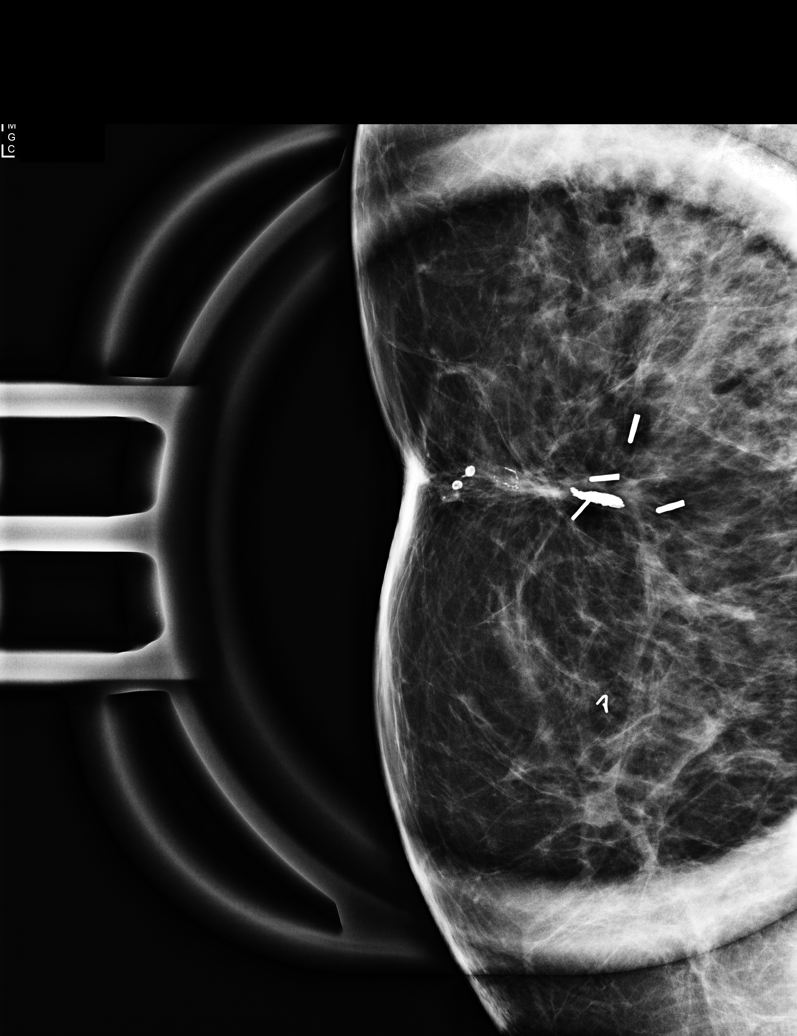

[R ML (3 of 3)]
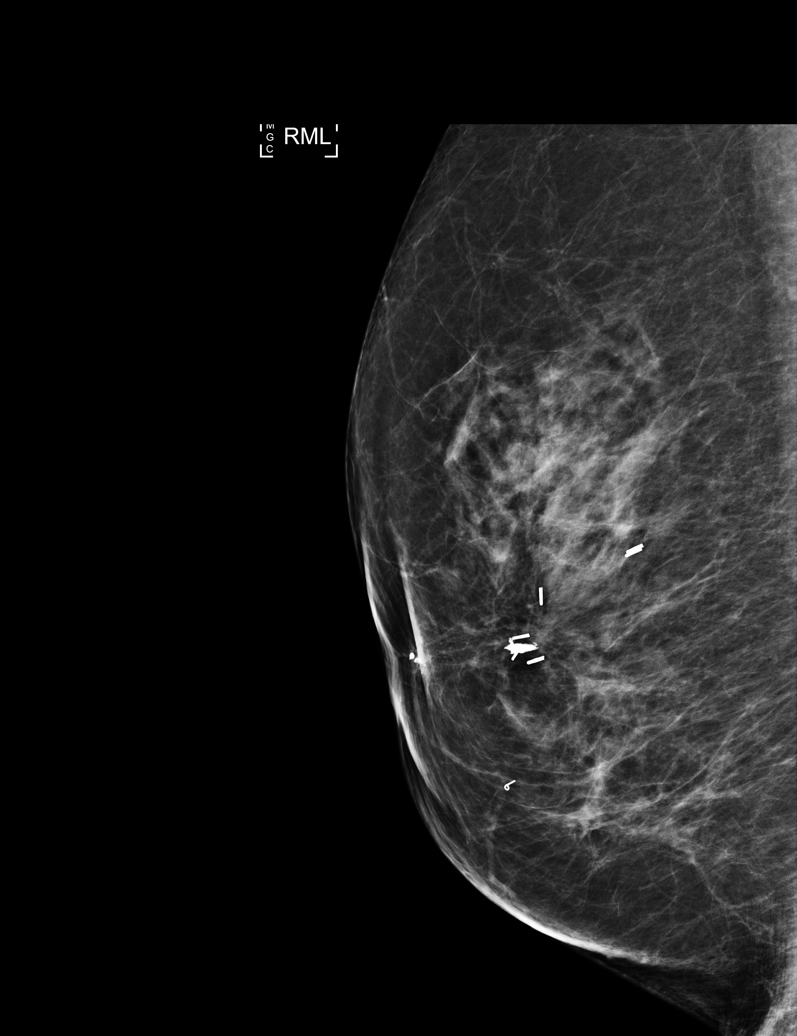

[R ML synth-2D]
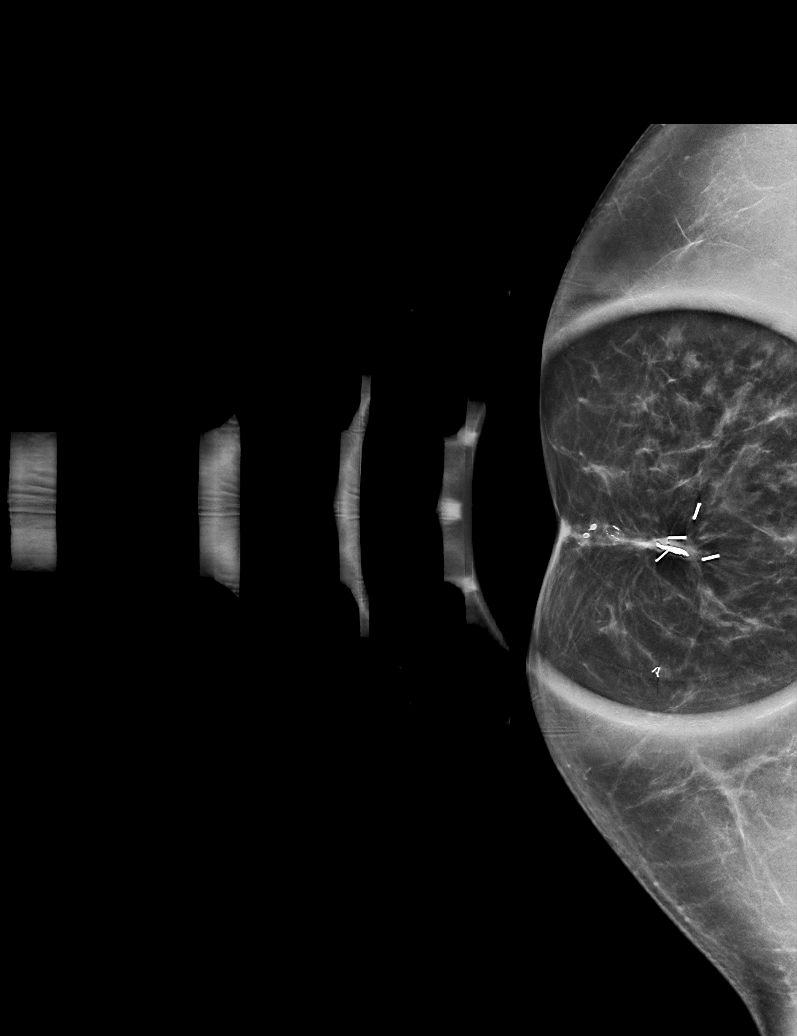

[R ML tomo · tomo slice 29/56.0]
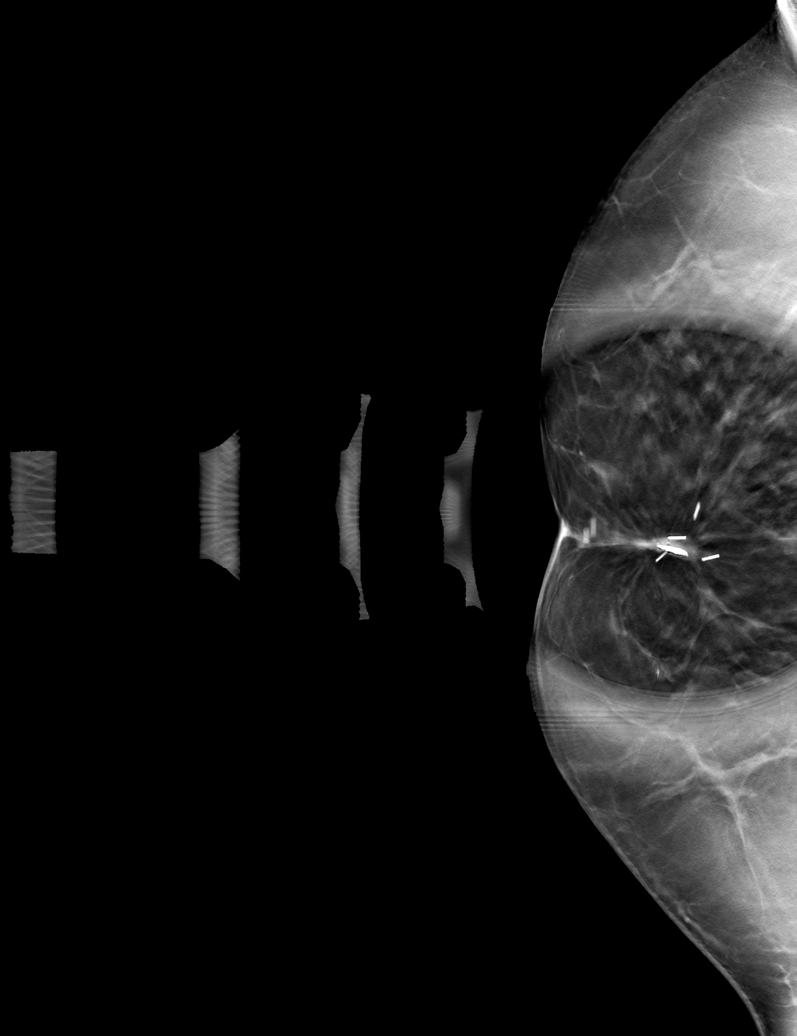

[6 of 10 positions shown; findings below may reference images not displayed]

ACR Breast Density Category c: The breast tissue is heterogeneously
dense, which may obscure small masses.
FINDINGS: Spot compression magnification images demonstrates a 4 mm group of
calcifications. On the true lateral spot compression magnification
images these calcifications appear to be developing in an eggshell
pattern. Spot compression tomosynthesis images through this region
demonstrates that the calcifications are forming surrounding a small
oil cyst.
IMPRESSION: The calcifications in the anterior right breast correspond with
benign fat necrosis from the patient's prior lumpectomy.

RECOMMENDATION:
Screening mammogram in one year.(Code:[id number])

I have discussed the findings and recommendations with the patient.
If applicable, a reminder letter will be sent to the patient
regarding the next appointment.

BI-RADS CATEGORY  2: Benign.

## 2023-01-15 DIAGNOSIS — Z6825 Body mass index (BMI) 25.0-25.9, adult: Secondary | ICD-10-CM | POA: Diagnosis not present

## 2023-01-15 DIAGNOSIS — Z713 Dietary counseling and surveillance: Secondary | ICD-10-CM | POA: Diagnosis not present

## 2023-01-15 DIAGNOSIS — E781 Pure hyperglyceridemia: Secondary | ICD-10-CM | POA: Diagnosis not present

## 2023-01-15 DIAGNOSIS — Z79899 Other long term (current) drug therapy: Secondary | ICD-10-CM | POA: Diagnosis not present

## 2023-01-17 DIAGNOSIS — D2272 Melanocytic nevi of left lower limb, including hip: Secondary | ICD-10-CM | POA: Diagnosis not present

## 2023-01-17 DIAGNOSIS — Z85828 Personal history of other malignant neoplasm of skin: Secondary | ICD-10-CM | POA: Diagnosis not present

## 2023-01-17 DIAGNOSIS — D2261 Melanocytic nevi of right upper limb, including shoulder: Secondary | ICD-10-CM | POA: Diagnosis not present

## 2023-01-17 DIAGNOSIS — D2271 Melanocytic nevi of right lower limb, including hip: Secondary | ICD-10-CM | POA: Diagnosis not present

## 2023-02-13 DIAGNOSIS — E785 Hyperlipidemia, unspecified: Secondary | ICD-10-CM | POA: Diagnosis not present

## 2023-02-13 DIAGNOSIS — F39 Unspecified mood [affective] disorder: Secondary | ICD-10-CM | POA: Diagnosis not present

## 2023-02-13 DIAGNOSIS — Z0000 Encounter for general adult medical examination without abnormal findings: Secondary | ICD-10-CM | POA: Diagnosis not present

## 2023-03-12 ENCOUNTER — Other Ambulatory Visit: Payer: Self-pay | Admitting: Obstetrics and Gynecology

## 2023-03-12 DIAGNOSIS — K2270 Barrett's esophagus without dysplasia: Secondary | ICD-10-CM | POA: Diagnosis not present

## 2023-03-12 DIAGNOSIS — Z1339 Encounter for screening examination for other mental health and behavioral disorders: Secondary | ICD-10-CM | POA: Diagnosis not present

## 2023-03-12 DIAGNOSIS — Z1331 Encounter for screening for depression: Secondary | ICD-10-CM | POA: Diagnosis not present

## 2023-03-12 DIAGNOSIS — Z0000 Encounter for general adult medical examination without abnormal findings: Secondary | ICD-10-CM | POA: Diagnosis not present

## 2023-03-12 DIAGNOSIS — Z1231 Encounter for screening mammogram for malignant neoplasm of breast: Secondary | ICD-10-CM

## 2023-03-19 ENCOUNTER — Ambulatory Visit
Admission: RE | Admit: 2023-03-19 | Discharge: 2023-03-19 | Disposition: A | Discharge status: Home / Self Care | Payer: BC Managed Care – PPO | Source: Ambulatory Visit | Attending: Obstetrics and Gynecology | Admitting: Obstetrics and Gynecology

## 2023-03-19 DIAGNOSIS — Z1231 Encounter for screening mammogram for malignant neoplasm of breast: Secondary | ICD-10-CM | POA: Diagnosis not present

## 2023-05-06 ENCOUNTER — Encounter: Payer: Self-pay | Admitting: *Deleted

## 2023-05-07 ENCOUNTER — Encounter: Payer: Self-pay | Admitting: Neurology

## 2023-05-07 ENCOUNTER — Ambulatory Visit: Payer: BC Managed Care – PPO | Admitting: Neurology

## 2023-05-07 VITALS — BP 111/72 | HR 65 | Ht 65.5 in | Wt 163.0 lb

## 2023-05-07 DIAGNOSIS — Z9189 Other specified personal risk factors, not elsewhere classified: Secondary | ICD-10-CM | POA: Diagnosis not present

## 2023-05-07 DIAGNOSIS — R0683 Snoring: Secondary | ICD-10-CM

## 2023-05-07 DIAGNOSIS — G4700 Insomnia, unspecified: Secondary | ICD-10-CM

## 2023-05-07 DIAGNOSIS — G479 Sleep disorder, unspecified: Secondary | ICD-10-CM | POA: Diagnosis not present

## 2023-05-07 DIAGNOSIS — R351 Nocturia: Secondary | ICD-10-CM

## 2023-05-07 NOTE — Patient Instructions (Signed)

## 2023-05-07 NOTE — Progress Notes (Signed)
Subjective:    Patient ID: Stephanie Malone is a 67 y.o. female.  HPI    Huston Foley, MD, PhD [facility] [address], [address] [address] [place], [place] [address]  Dear Dr. Felipa Eth,  I saw your patient, Stephanie Malone, upon your kind request in my sleep clinic today for initial consultation of her sleep disorder, in particular, concern for underlying obstructive sleep apnea.  The patient is unaccompanied today.  As you know, Ms. [name] is a 67 year old female with an underlying medical history of migraine headaches, degenerative disc disease with chronic back pain, mood disorder, hyperlipidemia, allergies, asthma, breast cancer with status post right partial mastectomy, who reports snoring and sleep disruption, nonrestorative sleep and daytime somnolence.  Her Epworth sleepiness score is 9 out of 24, fatigue severity score is 9 out of 24.  Fatigue severity score is 30 out of 63.  She lives with her husband, has 3 children only, she does not currently work.  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  She quit smoking in 1997.  She drinks alcohol infrequently, 1 or 2 glasses of wine per month on average.  She drinks caffeine in the form of iced tea, about 1 large serving per day, sometimes a second setting.  She has nocturia about once or twice per average night, denies recurrent morning headaches.  She is a light sleeper and her husband snoring sometimes disturbs her.  She has chronic difficulty initiating and maintaining sleep and has been on trazodone for years.  She takes 50 to 100 mg.  Sometimes 50 is not enough but 100 mg does make her drowsy during the day.  She also takes Xanax 0.5 mg as needed at bedtime, does not take it every night and does not combine it with the trazodone.  Very occasionally does she have restless leg symptoms but is not endorsing any consistent symptoms or staying awake because of leg discomfort.  She is not known to kick  or twitch in her sleep.  She is not aware of any family history of sleep apnea.  Her mom had restless legs.  I reviewed your office note from 03/12/2023.Marland Kitchen   Her Past Medical History Is Significant For: Past Medical History:  Diagnosis Date   Anxiety    Asthma    Breast cancer, IDC, Right, ER+,PR+, HER2- 07/10/2011   Chronic back pain    Degenerative lumbar disc    GERD (gastroesophageal reflux disease)    Migraine    Personal history of radiation therapy     Her Past Surgical History Is Significant For: Past Surgical History:  Procedure Laterality Date   ABDOMINAL HYSTERECTOMY     and rectocele repair   BLADDER SUSPENSION     BREAST LUMPECTOMY  08/15/2011   Procedure: BREAST LUMPECTOMY WITH EXCISION OF SENTINEL NODE;  Surgeon: Currie Paris, MD;  Location: [facility];  Service: General;  Laterality: Right;   ENDOMETRIAL ABLATION     MASTECTOMY PARTIAL / LUMPECTOMY  08/15/2011   right central with SLN - Dr Jamey Ripa, right   MOHS  2013    Her Family History Is Significant For: Family History  Problem Relation Age of Onset   Hypertension Mother    Stroke Mother    Heart disease Mother    Diabetes Mother    Atrial fibrillation Mother    Heart attack Mother    Liver cancer Father    Cirrhosis Father  Colon polyps Father    Diabetes Father    Multiple myeloma Paternal Grandmother    Pancreatic cancer Paternal Uncle    Sleep apnea Neg Hx     Her Social History Is Significant For: Social History   Socioeconomic History   Marital status: Married    Spouse name: Not on file   Number of children: 3   Years of education: Not on file   Highest education level: Not on file  Occupational History   Occupation: Charity fundraiser- homemaker    Employer: UNEMPLOYED  Tobacco Use   Smoking status: Former    Current packs/day: 0.00    Average packs/day: 0.3 packs/day for 12.0 years (3.6 ttl pk-yrs)    Types: Cigarettes    Start date: 08/27/1981    Quit date: 08/27/1993     Years since quitting: 29.7   Smokeless tobacco: Never  Substance and Sexual Activity   Alcohol use: Yes    Comment: wine cooler once "every month maybe"   Drug use: No   Sexual activity: Not on file  Other Topics Concern   Not on file  Social History Narrative   1 son- English as a second language teacher 1 daughter- [place]       Right handed   Caffeine: 1 large tea per day and sometimes a tea in the afternoon   Social Determinants of Health   Financial Resource Strain: Not on file  Food Insecurity: Not on file  Transportation Needs: Not on file  Physical Activity: Not on file  Stress: Not on file  Social Connections: Not on file    Her Allergies Are:  No Known Allergies:   Her Current Medications Are:  Outpatient Encounter Medications as of 05/07/2023  Medication Sig   ALPRAZolam (XANAX) 0.5 MG tablet Take 0.5 mg by mouth at bedtime as needed.     fexofenadine (ALLEGRA ALLERGY) 180 MG tablet one po as needed Oral   naproxen sodium (ANAPROX) 220 MG tablet Take 220 mg by mouth as needed. Reported on 02/01/2016   omeprazole (PRILOSEC) 20 MG capsule Take 20 mg by mouth daily.   SUMAtriptan (IMITREX) 100 MG tablet Take 100 mg by mouth as needed. Reported on 02/01/2016   traZODone (DESYREL) 100 MG tablet Take 50-100 mg by mouth at bedtime as needed.   Melatonin 1 MG CAPS AS NEEDED FOR SLEEP (Patient not taking: Reported on 05/07/2023)   [DISCONTINUED] Omega-3 Fatty Acids (FISH OIL) 1000 MG CAPS Take by mouth. (Patient not taking: Reported on 05/07/2023)   [DISCONTINUED] ondansetron (ZOFRAN-ODT) 8 MG disintegrating tablet  (Patient not taking: Reported on 05/07/2023)   [DISCONTINUED] Semaglutide-Weight Management (WEGOVY) 1 MG/0.5ML SOAJ Inject 1 mg into the skin every 7 (seven) days. (Patient not taking: Reported on 05/07/2023)   No facility-administered encounter medications on file as of 05/07/2023.  :   Review of Systems:  Out of a complete 14 point review of systems, all are  reviewed and negative with the exception of these symptoms as listed below:  Review of Systems  Neurological:        Patient is here alone for sleep consult. She endorses snoring and fatigue for the last 6 months. She has had trouble sleeping since she went through menopause. She has never had a sleep study before. ESS 9     Objective:  Neurological Exam  Physical Exam Physical Examination:   Vitals:   05/07/23 1043  BP: 111/72  Pulse: 65    General Examination: The patient is a very pleasant 66  y.o. female in no acute distress. She appears well-developed and well-nourished and well groomed.   HEENT: Normocephalic, atraumatic, pupils are equal, round and reactive to light, extraocular tracking is good without limitation to gaze excursion or nystagmus noted. Hearing is grossly intact. Face is symmetric with normal facial animation. Speech is clear with no dysarthria noted. There is no hypophonia. There is no lip, neck/head, jaw or voice tremor. Neck is supple with full range of passive and active motion. There are no carotid bruits on auscultation. Oropharynx exam reveals: mild mouth dryness, good dental hygiene and mild airway crowding, due to small airway entry and slightly wider uvula, Mallampati class II.  Tonsils on the smaller side, about 1+ bilaterally.  Minimal overbite noted.  Neck circumference 13 5/8 inches.  Tongue protrudes centrally and palate elevates symmetrically.  Chest: Clear to auscultation without wheezing, rhonchi or crackles noted.  Heart: S1+S2+0, regular and normal without murmurs, rubs or gallops noted.   Abdomen: Soft, non-tender and non-distended.  Extremities: There is no pitting edema in the distal lower extremities bilaterally.   Skin: Warm and dry without trophic changes noted.   Musculoskeletal: exam reveals no obvious joint deformities.   Neurologically:  Mental status: The patient is awake, alert and oriented in all 4 spheres. Her immediate and  remote memory, attention, language skills and fund of knowledge are appropriate. There is no evidence of aphasia, agnosia, apraxia or anomia. Speech is clear with normal prosody and enunciation. Thought process is linear. Mood is normal and affect is normal.  Cranial nerves II - XII are as described above under HEENT exam.  Motor exam: Normal bulk, strength and tone is noted. There is no obvious action or resting tremor.  Fine motor skills and coordination: grossly intact.  Cerebellar testing: No dysmetria or intention tremor. There is no truncal or gait ataxia.  Sensory exam: intact to light touch in the upper and lower extremities.  Gait, station and balance: She stands easily. No veering to one side is noted. No leaning to one side is noted. Posture is age-appropriate and stance is narrow based. Gait shows normal stride length and normal pace. No problems turning are noted.   Assessment and Plan:  In summary, Christian A Klaphake is a very pleasant 67 y.o.-year old female with an underlying medical history of migraine headaches, degenerative disc disease with chronic back pain, mood disorder, hyperlipidemia, allergies, asthma, breast cancer with status post right partial mastectomy, who presents for evaluation of her sleep disturbance, including chronic difficulty initiating and maintaining sleep, sleep disruption, daytime tiredness.  She may be at risk for underlying sleep disordered breathing, particularly obstructive sleep apnea (OSA).  While a laboratory attended sleep study is typically considered "gold standard" for evaluation of sleep disordered breathing, we mutually agreed to proceed with a home sleep test at this time.  She would prefer a test at home as she is worried that she would not be able to sleep in the sleep lab.  She does not tend to sleep well outside of her home.   I had a long chat with the patient about my findings and the diagnosis of sleep apnea, particularly OSA, its prognosis and  treatment options. We talked about medical/conservative treatments, surgical interventions and non-pharmacological approaches for symptom control. I explained, in particular, the risks and ramifications of untreated moderate to severe OSA, especially with respect to developing cardiovascular disease down the road, including congestive heart failure (CHF), difficult to treat hypertension, cardiac arrhythmias (particularly A-fib), neurovascular  complications including TIA, stroke and dementia. Even type 2 diabetes has, in part, been linked to untreated OSA. Symptoms of untreated OSA may include (but may not be limited to) daytime sleepiness, nocturia (i.e. frequent nighttime urination), memory problems, mood irritability and suboptimally controlled or worsening mood disorder such as depression and/or anxiety, lack of energy, lack of motivation, physical discomfort, as well as recurrent headaches, especially morning or nocturnal headaches. We talked about the importance of maintaining a healthy lifestyle and striving for healthy weight. In addition, we talked about the importance of striving for and maintaining good sleep hygiene. I recommended a sleep study at this time. I outlined the differences between a laboratory attended sleep study which is considered more comprehensive and accurate over the option of a home sleep test (HST); the latter may lead to underestimation of sleep disordered breathing in some instances and does not help with diagnosing upper airway resistance syndrome and is not accurate enough to diagnose primary central sleep apnea typically. I outlined possible surgical and non-surgical treatment options of OSA, including the use of a positive airway pressure (PAP) device (i.e. CPAP, AutoPAP/APAP or BiPAP in certain circumstances), a custom-made dental device (aka oral appliance, which would require a referral to a specialist dentist or orthodontist typically, and is generally speaking not  considered for patients with full dentures or edentulous state), upper airway surgical options, such as traditional UPPP (which is not considered a first-line treatment) or the Inspire device (hypoglossal nerve stimulator, which would involve a referral for consultation with an ENT surgeon, after careful selection, following inclusion criteria - also not first-line treatment). I explained the PAP treatment option to the patient in detail, as this is generally considered first-line treatment.  The patient indicated that she would be willing to try PAP therapy, if the need arises. I explained the importance of being compliant with PAP treatment, not only for insurance purposes but primarily to improve patient's symptoms symptoms, and for the patient's long term health benefit, including to reduce Her cardiovascular risks longer-term.    We will pick up our discussion about the next steps and treatment options after testing.  As such, if she has mild sleep disordered breathing, she would prefer treatment with a dental device, would like to avoid a PAP machine.   We will keep her posted as to the test results by phone call and/or MyChart messaging where possible.  We will plan to follow-up in sleep clinic accordingly as well.  I answered all her questions today and the patient was in agreement.   I encouraged her to call with any interim questions, concerns, problems or updates or email [place] through MyChart.  Generally speaking, sleep test authorizations may take up to 2 weeks, sometimes less, sometimes longer, the patient is encouraged to get in touch with [place] if they do not hear back from the sleep lab staff directly within the next 2 weeks.  Thank you very much for allowing me to participate in the care of this nice patient. If I can be of any further assistance to you please do not hesitate to call me at [phone].  Sincerely,   Huston Foley, MD, PhD

## 2023-05-21 ENCOUNTER — Ambulatory Visit: Payer: BC Managed Care – PPO | Admitting: Neurology

## 2023-05-21 DIAGNOSIS — R351 Nocturia: Secondary | ICD-10-CM

## 2023-05-21 DIAGNOSIS — G479 Sleep disorder, unspecified: Secondary | ICD-10-CM

## 2023-05-21 DIAGNOSIS — G4700 Insomnia, unspecified: Secondary | ICD-10-CM

## 2023-05-21 DIAGNOSIS — Z9189 Other specified personal risk factors, not elsewhere classified: Secondary | ICD-10-CM

## 2023-05-21 DIAGNOSIS — R0683 Snoring: Secondary | ICD-10-CM

## 2023-05-21 DIAGNOSIS — G4733 Obstructive sleep apnea (adult) (pediatric): Secondary | ICD-10-CM

## 2023-05-22 ENCOUNTER — Encounter: Payer: Self-pay | Admitting: Sports Medicine

## 2023-05-22 ENCOUNTER — Ambulatory Visit (INDEPENDENT_AMBULATORY_CARE_PROVIDER_SITE_OTHER): Payer: BC Managed Care – PPO | Admitting: Sports Medicine

## 2023-05-22 VITALS — BP 110/76 | Ht 65.5 in | Wt 163.0 lb

## 2023-05-22 DIAGNOSIS — S76301A Unspecified injury of muscle, fascia and tendon of the posterior muscle group at thigh level, right thigh, initial encounter: Secondary | ICD-10-CM | POA: Diagnosis not present

## 2023-05-22 NOTE — Progress Notes (Signed)
   Subjective:    Patient ID: Stephanie Malone, female    DOB: Sep 23, 1955, 67 y.o.   MRN: [id number]  HPI chief complaint: Right hamstring pain  Patient is a very pleasant 67 year old female that comes in today after having injured her right hamstring while waterskiing 3 days ago.  She felt an immediate pain along the posterior lateral hip.  She did not notice any bruising or swelling.  Her pain is improving but has not resolved.  Pain will radiate occasionally into the hamstring.  No significant hamstring injuries in the past although she does have a history of calf.  She has been taking over-the-counter Aleve which has been helping.  Past medical history reviewed Medications reviewed Allergies reviewed    Review of Systems As above    Objective:   Physical Exam  Well-developed, well-nourished.  No acute distress  Right hamstring: Minimal tenderness to palpation along the proximal hamstring.  No palpable defect.  No ecchymosis.  No soft tissue swelling.  No obvious deformity.  Good hamstring strength with resisted knee flexion.  No pain with resisted abduction.  Neurovascularly intact distally.  Walking with a slight limp.     Assessment & Plan:   Acute right hamstring strain  [place] will [facility] physical [facility].  This will allow her to transition quicker to the gym.  I have also recommended that she purchase a body helix compression sleeve to wear when active.  She may resume some recreational walking as long as her pain allows.  She will defer all other exercise until evaluation by PT.  She may wean to a home exercise program per the therapist discretion and will follow-up with me for ongoing or recalcitrant issues.  This note was dictated using Dragon naturally speaking software and may contain errors in syntax, spelling, or content which have not been identified prior to signing this note.

## 2023-05-23 ENCOUNTER — Telehealth: Payer: Self-pay | Admitting: Neurology

## 2023-05-23 NOTE — Telephone Encounter (Signed)
Patient referred by Dr. Felipa Eth, seen by me on 05/07/2023, HST on 05/21/2023.   Please call and notify the patient that the recent home sleep test did not show any significant obstructive sleep apnea.  She does not need to be treated with an AutoPap or CPAP machine.  At this juncture, she can follow-up with her primary care as scheduled.  Thanks,  Huston Foley, MD, PhD [facility])

## 2023-05-23 NOTE — Patient Instructions (Signed)
   [facility]  HOME SLEEP TEST (Watch PAT) REPORT  STUDY DATE: 05/21/2023  DOB: [birth year]  MRN: [id number]  ORDERING CLINICIAN: Huston Foley, MD, PhD   REFERRING CLINICIAN: Chilton Greathouse, MD   CLINICAL INFORMATION/HISTORY: 67 year old female with an underlying medical history of migraine headaches, degenerative disc disease with chronic back pain, mood disorder, hyperlipidemia, allergies, asthma, breast cancer with status post right partial mastectomy, who reports snoring and sleep disruption, nonrestorative sleep and daytime somnolence.   Epworth sleepiness score: 9/24.  BMI: 27.2 kg/m  FINDINGS:   Sleep Summary:   Total Recording Time (hours, min): 8 hours, 55 min  Total Sleep Time (hours, min):  7 hours, 15 min  Percent REM (%):    17.6%   Respiratory Indices:   Calculated pAHI (per hour):  4.1/hour         REM pAHI:    16/hour       NREM pAHI: 1.4/hour  Central pAHI: 0/hour  Oxygen Saturation Statistics:    Oxygen Saturation (%) Mean: 95%   Minimum oxygen saturation (%):                 90%   O2 Saturation Range (%): 90-98%    O2 Saturation (minutes) <=88%: 0 min  Pulse Rate Statistics:   Pulse Mean (bpm):    63/min    Pulse Range (52-97/min)   IMPRESSION: Primary snoring   RECOMMENDATION:  This home sleep test does not demonstrate any significant obstructive or central sleep disordered breathing with a total AHI of less than 5/hour.  Her total AHI was 4.1/h, O2 nadir 90%.  Mild snoring was detected, at times in the moderate range. Treatment with a positive airway pressure device such as AutoPap or CPAP is not indicated based on this test.  Snoring may improve with avoidance of the supine sleep position and weight loss (where clinically appropriate).  For disturbing snoring, an oral appliance through dentistry or orthodontics can be considered.  Other causes of the patient's symptoms, including circadian rhythm disturbances, an  underlying mood disorder, medication effect and/or an underlying medical problem cannot be ruled out based on this test. Clinical correlation is recommended.  The patient should be cautioned not to drive, work at heights, or operate dangerous or heavy equipment when tired or sleepy. Review and reiteration of good sleep hygiene measures should be pursued with any patient. The patient will be advised to follow up with her referring provider, who will be notified of the test results.   I certify that I have reviewed the raw data recording prior to the issuance of this report in accordance with the standards of the American Academy of Sleep Medicine (AASM).    INTERPRETING PHYSICIAN:   Huston Foley, MD, PhD Medical Director, [facility]) Diplomat, ABPN (Neurology and Sleep)   [facility] [address], [address] [place], [place] [address] [phone]

## 2023-05-27 ENCOUNTER — Ambulatory Visit: Payer: BC Managed Care – PPO | Attending: Sports Medicine | Admitting: Physical Therapy

## 2023-05-27 ENCOUNTER — Ambulatory Visit: Payer: BC Managed Care – PPO | Admitting: Family Medicine

## 2023-05-27 ENCOUNTER — Encounter: Payer: Self-pay | Admitting: Physical Therapy

## 2023-05-27 ENCOUNTER — Other Ambulatory Visit: Payer: Self-pay

## 2023-05-27 DIAGNOSIS — S76311D Strain of muscle, fascia and tendon of the posterior muscle group at thigh level, right thigh, subsequent encounter: Secondary | ICD-10-CM | POA: Diagnosis not present

## 2023-05-27 DIAGNOSIS — R252 Cramp and spasm: Secondary | ICD-10-CM

## 2023-05-27 DIAGNOSIS — M6281 Muscle weakness (generalized): Secondary | ICD-10-CM | POA: Diagnosis not present

## 2023-05-27 DIAGNOSIS — X500XXD Overexertion from strenuous movement or load, subsequent encounter: Secondary | ICD-10-CM | POA: Insufficient documentation

## 2023-05-27 DIAGNOSIS — M79651 Pain in right thigh: Secondary | ICD-10-CM | POA: Insufficient documentation

## 2023-05-27 DIAGNOSIS — M79604 Pain in right leg: Secondary | ICD-10-CM

## 2023-05-27 DIAGNOSIS — S76301D Unspecified injury of muscle, fascia and tendon of the posterior muscle group at thigh level, right thigh, subsequent encounter: Secondary | ICD-10-CM | POA: Diagnosis not present

## 2023-05-27 NOTE — Telephone Encounter (Signed)
I spoke with the patient and discussed SS results as noted below by Dr Frances Furbish. The patient verbalized understanding and appreciation. She did mention that she saw in report that she had mild to moderate snoring. She is interested in having a dental consultation with Dr Irene Limbo for possible oral appliance. If Dr Frances Furbish approves, will place referral.

## 2023-05-27 NOTE — Therapy (Signed)
OUTPATIENT PHYSICAL THERAPY LOWER EXTREMITY EVALUATION   Patient Name: Stephanie Malone MRN: [id number] DOB:April 18, 1956, 67 y.o., female Today's Date: 05/27/2023  END OF SESSION:  PT End of Session - 05/27/23 1351     Visit Number 1    Date for PT Re-Evaluation 07/22/23    Authorization Type BCBS    PT Start Time 1100    PT Stop Time 1147    PT Time Calculation (min) 47 min    Activity Tolerance Patient tolerated treatment well    Behavior During Therapy WFL for tasks assessed/performed             Past Medical History:  Diagnosis Date   Anxiety    Asthma    Breast cancer, IDC, Right, ER+,PR+, HER2- 07/10/2011   Chronic back pain    Degenerative lumbar disc    GERD (gastroesophageal reflux disease)    Migraine    Personal history of radiation therapy    Past Surgical History:  Procedure Laterality Date   ABDOMINAL HYSTERECTOMY     and rectocele repair   BLADDER SUSPENSION     BREAST LUMPECTOMY  08/15/2011   Procedure: BREAST LUMPECTOMY WITH EXCISION OF SENTINEL NODE;  Surgeon: Currie Paris, MD;  Location: [facility];  Service: General;  Laterality: Right;   ENDOMETRIAL ABLATION     MASTECTOMY PARTIAL / LUMPECTOMY  08/15/2011   right central with SLN - Dr Jamey Ripa, right   MOHS  2013   Patient Active Problem List   Diagnosis Date Noted   Greater trochanteric pain syndrome 08/15/2017   Chronic pain of left knee 03/01/2015   Bilateral foot pain 12/09/2014   Thumb pain 05/11/2013   Calf pain 05/11/2013   History of melanoma, right upper back 11/28/2012   Hip pain, right 08/26/2012   Esophageal dysmotility 04/16/2012   Sacroiliac joint dysfunction 07/24/2011   Cancer of central portion of right female breast (HCC) 07/10/2011    PCP: Chilton Greathouse, MD  REFERRING PROVIDER: Ralene Cork, DO  REFERRING DIAG: [phone] (ICD-10-CM) - Right hamstring injury, initial encounter  THERAPY DIAG:  Pain in right leg  Cramp and spasm  Muscle  weakness (generalized)  Rationale for Evaluation and Treatment: Rehabilitation  ONSET DATE: 05/19/23  SUBJECTIVE:   SUBJECTIVE STATEMENT: Pt strained Rt hamstring 8 days ago on 05/19/23 when attempting to get up onto water skis - she felt a pop - points to posterior aspect of greater trochanter on where her pop was felt vs in Rt hamstring.  No bruising or swelling.  Feels very tight in Rt hamstring.  Over the past week I am walking, bending over is getting better.  I am generally very tight.   10 weeks ago I started lifting weights at [employer] (2x/week full body machine circuit) to get stronger before starting gym classes but I haven't been stretching like I need to.    PERTINENT HISTORY: Has history of Rt hip pain and Lt knee PAIN:  PAIN:  Are you having pain? Yes NPRS scale: 3/10 Pain location: Rt hamstring mid-belly Pain orientation: Right  PAIN TYPE: aching and tight Pain description: intermittent  Aggravating factors: bending forward from standing, extending knee on Rt side in sitting Relieving factors: iced several times, took Aleve for 5 days 2x/day   PRECAUTIONS: None  RED FLAGS: None   WEIGHT BEARING RESTRICTIONS: No  FALLS:  Has patient fallen in last 6 months? 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  PLOF: Independent  PATIENT GOALS: return to weight training at [employer], eventually try spin class  NEXT MD VISIT: as needed  OBJECTIVE:   DIAGNOSTIC FINDINGS: none to date  PATIENT SURVEYS:  FOTO 66% goal 77%  COGNITION: Overall cognitive status: Within functional limits for tasks assessed     SENSATION: WFL  EDEMA:  None present, no bruising   MUSCLE LENGTH: Hamstrings: Right 60 deg; Left 90 deg Limited end range hip flexors, gluteals, piriformis  bil  POSTURE: No Significant postural limitations  PALPATION: Tender to deep palpation mid-belly Rt lateral hamstring No tenderness present at proximal HS attachment on Rt Tender along Rt greater trochanter   TRUNK ROM: Forward flexion: fingertips to mid-shin before pull in Rt HS (normally can touch flat hands to floor)  LOWER EXTREMITY ROM: Bil ER 50 deg Rt hamstring limits trunk flexion and supine SLR   LOWER EXTREMITY MMT:  MMT Right eval Left eval  Hip flexion 4 5  Hip extension 4 5  Hip abduction 4 5  Hip adduction 4 5  Hip internal rotation 4 5  Hip external rotation 3+ 5  Knee flexion 3+ pain 5  Knee extension 4- 5  Ankle dorsiflexion    Ankle plantarflexion    Ankle inversion    Ankle eversion     (Blank rows = not tested)  GAIT: Distance walked: within clinic Assistive device utilized: None Level of assistance: Complete Independence Comments: lacking full Rt hip extension - protective and tight   TODAY'S TREATMENT:                                                                                                                              DATE:  05/27/23:  Initiated HEP Discussed placing lacrosse ball/tennis ball under Rt hamstring with seated LAQ, using massage gun or foam roller, massage or assisted massage PT encouraged return to gym machine circuits but avoid leg ext, leg curl for now   PATIENT EDUCATION:  Education details: [email or web address] Person educated: Patient Education method: Explanation, Demonstration, and Handouts Education comprehension: verbalized understanding and returned demonstration  HOME EXERCISE PROGRAM: Access Code: [email or web address] URL: [email or web address] Date: 05/27/2023 Prepared by: Loistine Simas Telisa Ohlsen  Exercises - Seated Hamstring Stretch  - 2 x daily - 7 x weekly - 1 sets - 3 reps - 20-30 hold - Supine Bridge  - 1 x daily - 7 x weekly - 2 sets - 10 reps - Seated Long Arc Quad  - 1 x daily - 7 x weekly - 2 sets - 10  reps  ASSESSMENT:  CLINICAL IMPRESSION: Patient is a 67 y.o. female who was seen today for physical therapy evaluation and treatment for Rt hamstring strain sufferred 8 days ago when trying to stand up on water skis.  She felt a pop but never had  bruising or swelling.  Tenerness is present in mid-belly of Rt lateral hamstring.  She has greatly reduced standing trunk flexion from her usual (used to be able to place flat hands on ground) secondary to Rt hamstring tightness.  Rt hamstring length measures 60 deg vs Lt measures 90 deg.  Pt uses [employer] 2x/week for weight machine circuit training but has not returned since injury.  PT advised her to return to weight training but hold off on knee ext and hamstring curl for now while we work on restoring extensibility of Rt HS.  Pt has history of Rt hip pain and weakness and demo'd strength of Rt hip and knee 3+/5 to 4/5 compared to 5/5 on Lt.  Pt will benefit from skilled PT to improve flexibility, ROM, return to gym and functional task performance.   OBJECTIVE IMPAIRMENTS: Abnormal gait, decreased activity tolerance, decreased mobility, decreased ROM, decreased strength, increased fascial restrictions, increased muscle spasms, impaired flexibility, improper body mechanics, and pain.   ACTIVITY LIMITATIONS: lifting, bending, sitting, squatting, dressing, and locomotion level  PARTICIPATION LIMITATIONS: cleaning, laundry, community activity, yard work, and gym workout  PERSONAL FACTORS: 1 comorbidity: history of Rt hip pain/weakness  are also affecting patient's functional outcome.   REHAB POTENTIAL: Excellent  CLINICAL DECISION MAKING: Stable/uncomplicated  EVALUATION COMPLEXITY: Low   GOALS: Goals reviewed with patient? Yes  SHORT TERM GOALS: Target date: 06/24/23 Pt will be ind with initial HEP Baseline: Goal status: INITIAL  2.  Pt will be able to demo standing trunk flexion to distal shin. Baseline: mid-shin with slow cautious  movement Goal status: INITIAL  3.  Rt hamstring flexibility with reach at least 70 deg Baseline: 60 deg Goal status: INITIAL  4.  Pt will be able to perform household tasks requiring modified range bending and squatting with min or no pain  Baseline:  Goal status: INITIAL    LONG TERM GOALS: Target date: 07/22/23  Pt will be ind with HEP and return to gym without exacerbation of symptoms and understanding of how to progress. Baseline:  Goal status: INITIAL  2.  Pt will improve FOTO score to at least 77% to demo improved function. Baseline: 66% Goal status: INITIAL  3.  Pt will demo standing trunk forward bend reaching fingertips to ground with min stretching through Rt hamstring to demo improved ROM and HS flexibility Baseline: fingers to mid shin, able to place flat hands on ground pre-injury Goal status: INITIAL  4.  Pt will improve Rt knee and hip strength to at least 4+/5 without pain on testing to allow her to return to [employer] machine full circuit workout Baseline:  Goal status: INITIAL  5.  Pt will report at least 80% improvement in Rt hamstring pain with all household tasks requiring bending and squatting. Baseline:  Goal status: INITIAL     PLAN:  PT FREQUENCY: 1x/week  PT DURATION: 8 weeks  PLANNED INTERVENTIONS: Therapeutic exercises, Therapeutic activity, Neuromuscular re-education, Balance training, Patient/Family education, Self Care, Joint mobilization, Stair training, Aquatic Therapy, Dry Needling, Electrical stimulation, Spinal mobilization, Cryotherapy, Moist heat, Taping, Ionotophoresis 4mg /ml Dexamethasone, and Manual therapy  PLAN FOR NEXT SESSION: STM Rt hamstring, f/u on return to [employer] machines with exception of knee ext and hamstring curl, progress HS flexibility, knee and hip strength on Rt as tol   Zenon Leaf, PT 05/27/23 1:52 PM

## 2023-05-28 ENCOUNTER — Ambulatory Visit: Payer: BC Managed Care – PPO | Admitting: Sports Medicine

## 2023-06-06 ENCOUNTER — Ambulatory Visit: Payer: BC Managed Care – PPO | Admitting: Physical Therapy

## 2023-06-06 ENCOUNTER — Encounter: Payer: Self-pay | Admitting: Physical Therapy

## 2023-06-06 DIAGNOSIS — X500XXD Overexertion from strenuous movement or load, subsequent encounter: Secondary | ICD-10-CM | POA: Diagnosis not present

## 2023-06-06 DIAGNOSIS — M6281 Muscle weakness (generalized): Secondary | ICD-10-CM | POA: Diagnosis not present

## 2023-06-06 DIAGNOSIS — M79651 Pain in right thigh: Secondary | ICD-10-CM | POA: Diagnosis not present

## 2023-06-06 DIAGNOSIS — S76301D Unspecified injury of muscle, fascia and tendon of the posterior muscle group at thigh level, right thigh, subsequent encounter: Secondary | ICD-10-CM | POA: Diagnosis not present

## 2023-06-06 DIAGNOSIS — M79604 Pain in right leg: Secondary | ICD-10-CM

## 2023-06-06 DIAGNOSIS — S76311D Strain of muscle, fascia and tendon of the posterior muscle group at thigh level, right thigh, subsequent encounter: Secondary | ICD-10-CM | POA: Diagnosis not present

## 2023-06-06 DIAGNOSIS — R252 Cramp and spasm: Secondary | ICD-10-CM

## 2023-06-06 NOTE — Therapy (Signed)
OUTPATIENT PHYSICAL THERAPY LOWER EXTREMITY TREATMENT   Patient Name: Stephanie Malone MRN: [id number] DOB:10-10-1955, 67 y.o., female Today's Date: 06/06/2023  END OF SESSION:  PT End of Session - 06/06/23 1102     Visit Number 2    Date for PT Re-Evaluation 07/22/23    Authorization Type BCBS    PT Start Time 1102    PT Stop Time 1145    PT Time Calculation (min) 43 min    Activity Tolerance Patient tolerated treatment well    Behavior During Therapy WFL for tasks assessed/performed              Past Medical History:  Diagnosis Date   Anxiety    Asthma    Breast cancer, IDC, Right, ER+,PR+, HER2- 07/10/2011   Chronic back pain    Degenerative lumbar disc    GERD (gastroesophageal reflux disease)    Migraine    Personal history of radiation therapy    Past Surgical History:  Procedure Laterality Date   ABDOMINAL HYSTERECTOMY     and rectocele repair   BLADDER SUSPENSION     BREAST LUMPECTOMY  08/15/2011   Procedure: BREAST LUMPECTOMY WITH EXCISION OF SENTINEL NODE;  Surgeon: Currie Paris, MD;  Location: [facility];  Service: General;  Laterality: Right;   ENDOMETRIAL ABLATION     MASTECTOMY PARTIAL / LUMPECTOMY  08/15/2011   right central with SLN - Dr Jamey Ripa, right   MOHS  2013   Patient Active Problem List   Diagnosis Date Noted   Greater trochanteric pain syndrome 08/15/2017   Chronic pain of left knee 03/01/2015   Bilateral foot pain 12/09/2014   Thumb pain 05/11/2013   Calf pain 05/11/2013   History of melanoma, right upper back 11/28/2012   Hip pain, right 08/26/2012   Esophageal dysmotility 04/16/2012   Sacroiliac joint dysfunction 07/24/2011   Cancer of central portion of right female breast (HCC) 07/10/2011    PCP: Chilton Greathouse, MD  REFERRING PROVIDER: Ralene Cork, DO  REFERRING DIAG: [phone] (ICD-10-CM) - Right hamstring injury, initial encounter  THERAPY DIAG:  Cramp and spasm  Muscle weakness  (generalized)  Pain in right leg  Rationale for Evaluation and Treatment: Rehabilitation  ONSET DATE: 05/19/23  SUBJECTIVE:   SUBJECTIVE STATEMENT: I am slowly improving each day. I am able to bend further but still not where I used to be.  I noticed it was too tight for me to reach to make the bed.  I am doing the stretches several times a day.  Eval: Pt strained Rt hamstring 8 days ago on 05/19/23 when attempting to get up onto water skis - she felt a pop - points to posterior aspect of greater trochanter on where her pop was felt vs in Rt hamstring.  No bruising or swelling.  Feels very tight in Rt hamstring.  Over the past week I am walking, bending over is getting better.  I am generally very tight.   10 weeks ago I started lifting weights at [employer] (2x/week full body machine circuit) to get stronger before starting gym classes but I haven't been stretching like I need to.    PERTINENT HISTORY: Has history of Rt hip pain and Lt knee PAIN:  PAIN:  Are you having pain? Yes NPRS scale: 3/10 Pain location: Rt hamstring mid-belly Pain orientation: Right  PAIN TYPE: aching and tight Pain description: intermittent  Aggravating factors: bending forward from standing, extending knee on Rt side in sitting Relieving  factors: iced several times, took Aleve for 5 days 2x/day   PRECAUTIONS: None  RED FLAGS: None   WEIGHT BEARING RESTRICTIONS: No  FALLS:  Has patient fallen in last 6 months? 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  PLOF: Independent  PATIENT GOALS: return to weight training at [employer], eventually try spin class  NEXT MD VISIT: as needed  OBJECTIVE:   DIAGNOSTIC FINDINGS: none to date  PATIENT SURVEYS:  FOTO 66% goal 77%  COGNITION: Overall cognitive status:  Within functional limits for tasks assessed     SENSATION: WFL  EDEMA:  None present, no bruising   MUSCLE LENGTH: Hamstrings: Right 60 deg; Left 90 deg Limited end range hip flexors, gluteals, piriformis bil  POSTURE: No Significant postural limitations  PALPATION: Tender to deep palpation mi/d-belly Rt lateral hamstring No tenderness present at proximal HS attachment on Rt Tender along Rt greater trochanter   TRUNK ROM: Forward flexion: fingertips to mid-shin before pull in Rt HS (normally can touch flat hands to floor)  LOWER EXTREMITY ROM: Bil ER 50 deg Rt hamstring limits trunk flexion and supine SLR   LOWER EXTREMITY MMT:  MMT Right eval Left eval  Hip flexion 4 5  Hip extension 4 5  Hip abduction 4 5  Hip adduction 4 5  /Hip internal rotation 4 5  Hip external rotation 3+ 5  Knee flexion 3+ pain 5  Knee extension 4- 5  Ankle dorsiflexion    Ankle plantarflexion    Ankle inversion    Ankle eversion     (Blank rows = not tested)  GAIT: Distance walked: within clinic Assistive device utilized: None Level of assistance: Complete Independence Comments: lacking full Rt hip extension - protective and tight   TODAY'S TREATMENT:                                                                                                                              DATE:  06/06/23: Recumbent bike L2 x3' , L3 x 2' PT present to discuss status Seated HS stretch 2x20" bil Deadlift 10lb x10 - to mid-shin depth Standing T at counter Rt no weight x10 Supine Rt dynamic HS stretch light blue power cord around foot with TKE x 20 Supine HS curl feet on red ball with bridge x 20 Prone Rt knee flexion 4lb x15 Prone hip ext 4lb with knee at 90 deg x 15 Prone STM - deep stripping to Rt hamstring mid-belly, central and lateral HS Updated HEP  05/27/23:  Initiated HEP Discussed placing lacrosse ball/tennis ball under Rt hamstring with seated LAQ, using massage gun or foam roller,  massage or assisted massage PT encouraged return to gym machine circuits but avoid leg ext, leg curl for now   PATIENT EDUCATION:  Education details: [email or web address] Person educated: Patient Education method: Explanation, Demonstration, and Handouts Education comprehension: verbalized understanding and returned demonstration  HOME EXERCISE PROGRAM: Access Code: [email or web address] URL: [email or web address] Date: 06/06/2023 Prepared by: Loistine Simas Akaash Vandewater  Exercises - Seated Hamstring Stretch  - 2 x daily - 7 x weekly - 1 sets - 3 reps - 20-30 hold - Supine Bridge  - 1 x daily - 7 x weekly - 2 sets - 10 reps - Seated Long Arc Quad  - 1 x daily - 7 x weekly - 2 sets - 10 reps - Kettlebell Deadlift  - 1 x daily - 7 x weekly - 1 sets - 10 reps - Forward T  - 1 x daily - 7 x weekly - 1 sets - 10 reps  ASSESSMENT:  CLINICAL IMPRESSION: Pt is improving each day with flexibility of Rt hamstring. She has been compliant with HEP.  She continues to have area of restriction in mid-belly of Rt hamstring, central/laterally.  She repsonds well to stretching in open and closed chain but was unable to load forward T due to pain with attempted weight.  Deep stripping to Rt HS end of session and Pt able to slowly stretch in forward bend to fingers to ankles end of session.  She may benefit from DN (has had in past and knows it helps but makes her very anxious.)    OBJECTIVE IMPAIRMENTS: Abnormal gait, decreased activity tolerance, decreased mobility, decreased ROM, decreased strength, increased fascial restrictions, increased muscle spasms, impaired flexibility, improper body mechanics, and pain.   ACTIVITY LIMITATIONS: lifting, bending, sitting, squatting, dressing, and locomotion level  PARTICIPATION LIMITATIONS: cleaning, laundry, community activity, yard work, and gym workout  PERSONAL FACTORS: 1 comorbidity: history of Rt hip pain/weakness  are also affecting patient's functional outcome.   REHAB  POTENTIAL: Excellent  CLINICAL DECISION MAKING: Stable/uncomplicated  EVALUATION COMPLEXITY: Low   GOALS: Goals reviewed with patient? Yes  SHORT TERM GOALS: Target date: 06/24/23 Pt will be ind with initial HEP Baseline: Goal status: met 9/19  2.  Pt will be able to demo standing trunk flexion to distal shin. Baseline: mid-shin with slow cautious movement Goal status: met 9/19  3.  Rt hamstring flexibility with reach at least 70 deg Baseline: 60 deg Goal status: INITIAL  4.  Pt will be able to perform household tasks requiring modified range bending and squatting with min or no pain  Baseline:  Goal status: INITIAL    LONG TERM GOALS: Target date: 07/22/23  Pt will be ind with HEP and return to gym without exacerbation of symptoms and understanding of how to progress. Baseline:  Goal status: INITIAL  2.  Pt will improve FOTO score to at least 77% to demo improved function. Baseline: 66% Goal status: INITIAL  3.  Pt will demo standing trunk forward bend reaching fingertips to ground with min stretching through Rt hamstring to demo improved ROM and HS flexibility Baseline: fingers to mid shin, able to place flat hands on ground pre-injury Goal status: INITIAL  4.  Pt will improve Rt knee and hip strength to at least 4+/5 without pain on testing to allow her to return to [employer] machine full circuit workout Baseline:  Goal status: INITIAL  5.  Pt will report at least 80% improvement in Rt hamstring pain with all household tasks requiring bending and squatting. Baseline:  Goal status: INITIAL     PLAN:  PT FREQUENCY: 1x/week  PT DURATION: 8 weeks  PLANNED INTERVENTIONS: Therapeutic exercises,  Therapeutic activity, Neuromuscular re-education, Balance training, Patient/Family education, Self Care, Joint mobilization, Stair training, Aquatic Therapy, Dry Needling, Electrical stimulation, Spinal mobilization, Cryotherapy, Moist heat, Taping,  Ionotophoresis 4mg /ml Dexamethasone, and Manual therapy  PLAN FOR NEXT SESSION: may benefit from DN to Rt HS but maybe not next time b/c it will be her bday and she gets very anxious, STM Rt hamstring, f/u on return to [facility] machines with exception of knee ext and hamstring curl, progress HS flexibility, knee and hip strength on Rt as [employer], PT 06/06/23 11:54 AM

## 2023-06-10 ENCOUNTER — Ambulatory Visit: Payer: BC Managed Care – PPO

## 2023-06-10 DIAGNOSIS — X500XXD Overexertion from strenuous movement or load, subsequent encounter: Secondary | ICD-10-CM | POA: Diagnosis not present

## 2023-06-10 DIAGNOSIS — M79604 Pain in right leg: Secondary | ICD-10-CM

## 2023-06-10 DIAGNOSIS — S76301D Unspecified injury of muscle, fascia and tendon of the posterior muscle group at thigh level, right thigh, subsequent encounter: Secondary | ICD-10-CM | POA: Diagnosis not present

## 2023-06-10 DIAGNOSIS — S76311D Strain of muscle, fascia and tendon of the posterior muscle group at thigh level, right thigh, subsequent encounter: Secondary | ICD-10-CM | POA: Diagnosis not present

## 2023-06-10 DIAGNOSIS — M79651 Pain in right thigh: Secondary | ICD-10-CM | POA: Diagnosis not present

## 2023-06-10 DIAGNOSIS — R252 Cramp and spasm: Secondary | ICD-10-CM

## 2023-06-10 DIAGNOSIS — M6281 Muscle weakness (generalized): Secondary | ICD-10-CM | POA: Diagnosis not present

## 2023-06-10 NOTE — Therapy (Signed)
OUTPATIENT PHYSICAL THERAPY LOWER EXTREMITY TREATMENT   Patient Name: Stephanie Malone MRN: [id number] DOB:10-27-1955, 67 y.o., female Today's Date: 06/10/2023  END OF SESSION:  PT End of Session - 06/10/23 1711     Visit Number 3    Date for PT Re-Evaluation 07/22/23    Authorization Type BCBS    PT Start Time 1620    PT Stop Time 1710    PT Time Calculation (min) 50 min    Activity Tolerance Patient tolerated treatment well    Behavior During Therapy WFL for tasks assessed/performed               Past Medical History:  Diagnosis Date   Anxiety    Asthma    Breast cancer, IDC, Right, ER+,PR+, HER2- 07/10/2011   Chronic back pain    Degenerative lumbar disc    GERD (gastroesophageal reflux disease)    Migraine    Personal history of radiation therapy    Past Surgical History:  Procedure Laterality Date   ABDOMINAL HYSTERECTOMY     and rectocele repair   BLADDER SUSPENSION     BREAST LUMPECTOMY  08/15/2011   Procedure: BREAST LUMPECTOMY WITH EXCISION OF SENTINEL NODE;  Surgeon: Currie Paris, MD;  Location: [facility];  Service: General;  Laterality: Right;   ENDOMETRIAL ABLATION     MASTECTOMY PARTIAL / LUMPECTOMY  08/15/2011   right central with SLN - Dr Jamey Ripa, right   MOHS  2013   Patient Active Problem List   Diagnosis Date Noted   Greater trochanteric pain syndrome 08/15/2017   Chronic pain of left knee 03/01/2015   Bilateral foot pain 12/09/2014   Thumb pain 05/11/2013   Calf pain 05/11/2013   History of melanoma, right upper back 11/28/2012   Hip pain, right 08/26/2012   Esophageal dysmotility 04/16/2012   Sacroiliac joint dysfunction 07/24/2011   Cancer of central portion of right female breast (HCC) 07/10/2011    PCP: Chilton Greathouse, MD  REFERRING PROVIDER: Ralene Cork, DO  REFERRING DIAG: [phone] (ICD-10-CM) - Right hamstring injury, initial encounter  THERAPY DIAG:  Cramp and spasm  Pain in right  leg  Muscle weakness (generalized)  Rationale for Evaluation and Treatment: Rehabilitation  ONSET DATE: 05/19/23  SUBJECTIVE:   SUBJECTIVE STATEMENT: I went kayaking yesterday and it went well.  I am feeling better but I'm not doing my exercises much.  I want to get back to the gym.    Eval: Pt strained Rt hamstring 8 days ago on 05/19/23 when attempting to get up onto water skis - she felt a pop - points to posterior aspect of greater trochanter on where her pop was felt vs in Rt hamstring.  No bruising or swelling.  Feels very tight in Rt hamstring.  Over the past week I am walking, bending over is getting better.  I am generally very tight.   10 weeks ago I started lifting weights at [employer] (2x/week full body machine circuit) to get stronger before starting gym classes but I haven't been stretching like I need to.    PERTINENT HISTORY: Has history of Rt hip pain and Lt knee PAIN:  PAIN:  Are you having pain? Yes NPRS scale: 0/10 Pain location: Rt hamstring mid-belly Pain orientation: Right  PAIN TYPE: aching and tight Pain description: intermittent  Aggravating factors: bending forward from standing, extending knee on Rt side in sitting Relieving factors: iced several times, took Aleve for 5 days 2x/day   PRECAUTIONS:  None  RED FLAGS: None   WEIGHT BEARING RESTRICTIONS: No  FALLS:  Has patient fallen in last 6 months? 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  PLOF: Independent  PATIENT GOALS: return to weight training at [employer], eventually try spin class  NEXT MD VISIT: as needed  OBJECTIVE:   DIAGNOSTIC FINDINGS: none to date  PATIENT SURVEYS:  FOTO 66% goal 77%  COGNITION: Overall cognitive status: Within functional limits for tasks  assessed     SENSATION: WFL  EDEMA:  None present, no bruising   MUSCLE LENGTH: Hamstrings: Right 60 deg; Left 90 deg Limited end range hip flexors, gluteals, piriformis bil  POSTURE: No Significant postural limitations  PALPATION: Tender to deep palpation mi/d-belly Rt lateral hamstring No tenderness present at proximal HS attachment on Rt Tender along Rt greater trochanter   TRUNK ROM: Forward flexion: fingertips to mid-shin before pull in Rt HS (normally can touch flat hands to floor)  LOWER EXTREMITY ROM: Bil ER 50 deg Rt hamstring limits trunk flexion and supine SLR   LOWER EXTREMITY MMT:  MMT Right eval Left eval  Hip flexion 4 5  Hip extension 4 5  Hip abduction 4 5  Hip adduction 4 5  /Hip internal rotation 4 5  Hip external rotation 3+ 5  Knee flexion 3+ pain 5  Knee extension 4- 5  Ankle dorsiflexion    Ankle plantarflexion    Ankle inversion    Ankle eversion     (Blank rows = not tested)  GAIT: Distance walked: within clinic Assistive device utilized: None Level of assistance: Complete Independence Comments: lacking full Rt hip extension - protective and tight   TODAY'S TREATMENT:                                                                                                                              DATE:  06/10/23: NuStep: Level 3 x 6 minutes- PT present to discuss status Seated HS stretch 2x20" bil Seated figure 4 3x20 seconds  Deadlift 10lb 2x10 - to mid-shin depth Standing T at counter Rt and Lt x 10 each- tactile cues with WB on Rt Sidelying clam: with TA activation Prone Rt knee flexion 4lb x15 Prone hip ext 4lb with knee at 90 deg x 15 Manual: Addaday to Rt hamstring x 5 min  06/06/23: Recumbent bike L2 x3' , L3 x 2' PT present to discuss status Seated HS stretch 2x20" bil Deadlift 10lb x10 - to mid-shin depth Standing T at counter Rt no weight x10 Supine Rt dynamic HS stretch light blue power cord around foot with TKE x  20 Supine HS curl feet on red ball with bridge x 20 Prone Rt knee flexion 4lb x15 Prone hip ext 4lb with knee at 90  deg x 15 Prone STM - deep stripping to Rt hamstring mid-belly, central and lateral HS Updated HEP  05/27/23:  Initiated HEP Discussed placing lacrosse ball/tennis ball under Rt hamstring with seated LAQ, using massage gun or foam roller, massage or assisted massage PT encouraged return to gym machine circuits but avoid leg ext, leg curl for now  PATIENT EDUCATION:  Education details: [email or web address] Person educated: Patient Education method: Explanation, Demonstration, and Handouts Education comprehension: verbalized understanding and returned demonstration  HOME EXERCISE PROGRAM: Access Code: [email or web address] URL: [email or web address] Date: 06/06/2023 Prepared by: Loistine Simas Beuhring  Exercises - Seated Hamstring Stretch  - 2 x daily - 7 x weekly - 1 sets - 3 reps - 20-30 hold - Supine Bridge  - 1 x daily - 7 x weekly - 2 sets - 10 reps - Seated Long Arc Quad  - 1 x daily - 7 x weekly - 2 sets - 10 reps - Kettlebell Deadlift  - 1 x daily - 7 x weekly - 1 sets - 10 reps - Forward T  - 1 x daily - 7 x weekly - 1 sets - 10 reps  ASSESSMENT:  CLINICAL IMPRESSION: Pt reports that she is feeling better overall yet not doing her exercises as frequently as prescribed.  Pt with instability on the Rt vs Lt with forward T and PT provided cueing for pelvic alignment. She reports increased ROM with dead lift due to increased hamstring length.   Her goal is to get back to the gym and exercise regularly.  Pt had good response to Addaday and will consider massage gun for tissue mobility at home.  Patient will benefit from skilled PT to address the below impairments and improve overall function.   OBJECTIVE IMPAIRMENTS: Abnormal gait, decreased activity tolerance, decreased mobility, decreased ROM, decreased strength, increased fascial restrictions, increased muscle spasms, impaired  flexibility, improper body mechanics, and pain.   ACTIVITY LIMITATIONS: lifting, bending, sitting, squatting, dressing, and locomotion level  PARTICIPATION LIMITATIONS: cleaning, laundry, community activity, yard work, and gym workout  PERSONAL FACTORS: 1 comorbidity: history of Rt hip pain/weakness  are also affecting patient's functional outcome.   REHAB POTENTIAL: Excellent  CLINICAL DECISION MAKING: Stable/uncomplicated  EVALUATION COMPLEXITY: Low   GOALS: Goals reviewed with patient? Yes  SHORT TERM GOALS: Target date: 06/24/23 Pt will be ind with initial HEP Baseline: Goal status: met 9/19  2.  Pt will be able to demo standing trunk flexion to distal shin. Baseline: mid-shin with slow cautious movement Goal status: met 9/19  3.  Rt hamstring flexibility with reach at least 70 deg Baseline: 60 deg Goal status: INITIAL  4.  Pt will be able to perform household tasks requiring modified range bending and squatting with min or no pain  Baseline:  Goal status: INITIAL    LONG TERM GOALS: Target date: 07/22/23  Pt will be ind with HEP and return to gym without exacerbation of symptoms and understanding of how to progress. Baseline:  Goal status: INITIAL  2.  Pt will improve FOTO score to at least 77% to demo improved function. Baseline: 66% Goal status: INITIAL  3.  Pt will demo standing trunk forward bend reaching fingertips to ground with min stretching through Rt hamstring to demo improved ROM and HS flexibility Baseline: fingers to mid shin, able to place flat hands on ground pre-injury Goal status: INITIAL  4.  Pt will improve Rt knee and hip strength to at least 4+/5 without pain on testing to allow her to return  to [employer] machine full circuit workout Baseline:  Goal status: INITIAL  5.  Pt will report at least 80% improvement in Rt hamstring pain with all household tasks requiring bending and squatting. Baseline:  Goal status:  INITIAL     PLAN:  PT FREQUENCY: 1x/week  PT DURATION: 8 weeks  PLANNED INTERVENTIONS: Therapeutic exercises, Therapeutic activity, Neuromuscular re-education, Balance training, Patient/Family education, Self Care, Joint mobilization, Stair training, Aquatic Therapy, Dry Needling, Electrical stimulation, Spinal mobilization, Cryotherapy, Moist heat, Taping, Ionotophoresis 4mg /ml Dexamethasone, and Manual therapy  PLAN FOR NEXT SESSION: may benefit from DN to Rt HS but is anxious, manual to Rt hamstring, glute/hamstring strength, pelvic stability, progress HS flexibility, knee and hip strength on Rt as [employer], PT 06/10/23 5:12 PM

## 2023-06-19 DIAGNOSIS — Z01411 Encounter for gynecological examination (general) (routine) with abnormal findings: Secondary | ICD-10-CM | POA: Diagnosis not present

## 2023-06-19 DIAGNOSIS — N952 Postmenopausal atrophic vaginitis: Secondary | ICD-10-CM | POA: Diagnosis not present

## 2023-06-20 ENCOUNTER — Other Ambulatory Visit: Payer: Self-pay | Admitting: Obstetrics and Gynecology

## 2023-06-20 ENCOUNTER — Encounter: Payer: Self-pay | Admitting: Physical Therapy

## 2023-06-20 ENCOUNTER — Ambulatory Visit: Payer: BC Managed Care – PPO | Attending: Sports Medicine | Admitting: Physical Therapy

## 2023-06-20 DIAGNOSIS — Z13820 Encounter for screening for osteoporosis: Secondary | ICD-10-CM

## 2023-06-20 DIAGNOSIS — M6281 Muscle weakness (generalized): Secondary | ICD-10-CM | POA: Diagnosis not present

## 2023-06-20 DIAGNOSIS — M79604 Pain in right leg: Secondary | ICD-10-CM | POA: Insufficient documentation

## 2023-06-20 DIAGNOSIS — R252 Cramp and spasm: Secondary | ICD-10-CM | POA: Diagnosis not present

## 2023-06-20 NOTE — Therapy (Signed)
OUTPATIENT PHYSICAL THERAPY LOWER EXTREMITY TREATMENT   Patient Name: Stephanie Malone MRN: [id number] DOB:12-20-1955, 67 y.o., female Today's Date: 06/20/2023  END OF SESSION:  PT End of Session - 06/20/23 1146     Visit Number 4    Date for PT Re-Evaluation 07/22/23    Authorization Type BCBS    PT Start Time 1146    PT Stop Time 1228    PT Time Calculation (min) 42 min    Activity Tolerance Patient tolerated treatment well    Behavior During Therapy WFL for tasks assessed/performed                Past Medical History:  Diagnosis Date   Anxiety    Asthma    Breast cancer, IDC, Right, ER+,PR+, HER2- 07/10/2011   Chronic back pain    Degenerative lumbar disc    GERD (gastroesophageal reflux disease)    Migraine    Personal history of radiation therapy    Past Surgical History:  Procedure Laterality Date   ABDOMINAL HYSTERECTOMY     and rectocele repair   BLADDER SUSPENSION     BREAST LUMPECTOMY  08/15/2011   Procedure: BREAST LUMPECTOMY WITH EXCISION OF SENTINEL NODE;  Surgeon: Currie Paris, MD;  Location: [facility];  Service: General;  Laterality: Right;   ENDOMETRIAL ABLATION     MASTECTOMY PARTIAL / LUMPECTOMY  08/15/2011   right central with SLN - Dr Jamey Ripa, right   MOHS  2013   Patient Active Problem List   Diagnosis Date Noted   Greater trochanteric pain syndrome 08/15/2017   Chronic pain of left knee 03/01/2015   Bilateral foot pain 12/09/2014   Thumb pain 05/11/2013   Calf pain 05/11/2013   History of melanoma, right upper back 11/28/2012   Hip pain, right 08/26/2012   Esophageal dysmotility 04/16/2012   Sacroiliac joint dysfunction 07/24/2011   Cancer of central portion of right female breast (HCC) 07/10/2011    PCP: Chilton Greathouse, MD  REFERRING PROVIDER: Ralene Cork, DO  REFERRING DIAG: [phone] (ICD-10-CM) - Right hamstring injury, initial encounter  THERAPY DIAG:  Cramp and spasm  Pain in right  leg  Muscle weakness (generalized)  Rationale for Evaluation and Treatment: Rehabilitation  ONSET DATE: 05/19/23  SUBJECTIVE:   SUBJECTIVE STATEMENT: I am about 90% better.  I think today can be my last day.    Eval: Pt strained Rt hamstring 8 days ago on 05/19/23 when attempting to get up onto water skis - she felt a pop - points to posterior aspect of greater trochanter on where her pop was felt vs in Rt hamstring.  No bruising or swelling.  Feels very tight in Rt hamstring.  Over the past week I am walking, bending over is getting better.  I am generally very tight.   10 weeks ago I started lifting weights at [employer] (2x/week full body machine circuit) to get stronger before starting gym classes but I haven't been stretching like I need to.    PERTINENT HISTORY: Has history of Rt hip pain and Lt knee PAIN:  PAIN:  Are you having pain? Yes NPRS scale: 0/10 Pain location: Rt hamstring mid-belly Pain orientation: Right  PAIN TYPE: aching and tight Pain description: intermittent  Aggravating factors: bending forward from standing, extending knee on Rt side in sitting Relieving factors: iced several times, took Aleve for 5 days 2x/day   PRECAUTIONS: None  RED FLAGS: None   WEIGHT BEARING RESTRICTIONS: No  FALLS:  Has patient fallen in last 6 months? 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  PLOF: Independent  PATIENT GOALS: return to weight training at [employer], eventually try spin class  NEXT MD VISIT: as needed  OBJECTIVE:   DIAGNOSTIC FINDINGS: none to date  PATIENT SURVEYS:  FOTO 66% goal 77% FOTO 10/3 99%, MET GOAL  COGNITION: Overall cognitive status: Within functional limits for tasks assessed     SENSATION: WFL  EDEMA:  None present, no bruising    MUSCLE LENGTH: Hamstrings: Right 60 deg; Left 90 deg Limited end range hip flexors, gluteals, piriformis bil  POSTURE: No Significant postural limitations  PALPATION: Tender to deep palpation mi/d-belly Rt lateral hamstring No tenderness present at proximal HS attachment on Rt Tender along Rt greater trochanter   TRUNK ROM: Forward flexion: fingertips to mid-shin before pull in Rt HS (normally can touch flat hands to floor)  LOWER EXTREMITY ROM: Bil ER 50 deg Rt hamstring limits trunk flexion and supine SLR   LOWER EXTREMITY MMT:  MMT Right eval Left eval  Hip flexion 4 5  Hip extension 4 5  Hip abduction 4 5  Hip adduction 4 5  /Hip internal rotation 4 5  Hip external rotation 3+ 5  Knee flexion 3+ pain 5  Knee extension 4- 5  Ankle dorsiflexion    Ankle plantarflexion    Ankle inversion    Ankle eversion     (Blank rows = not tested)  GAIT: Distance walked: within clinic Assistive device utilized: None Level of assistance: Complete Independence Comments: lacking full Rt hip extension - protective and tight   TODAY'S TREATMENT:                                                                                                                              DATE:  06/20/23: Bike x6' PT present to discuss status FOTO - 99% STM Addaday and manual stripping and cross friction to Rt hamstring in prone Standing bil HS stretch foot on 3rd step x1' each Increasing depth deadlift 10lb  Trunk ROM assessment - able to flex forward to palms on floor Reviewed hip flexor stretches and lateral hip stretches  06/10/23: NuStep: Level 3 x 6 minutes- PT present to discuss status Seated HS stretch 2x20" bil Seated figure 4 3x20 seconds  Deadlift 10lb 2x10 - to mid-shin depth Standing T at counter Rt and Lt x 10 each- tactile cues with WB on Rt Sidelying clam: with TA activation Prone Rt knee flexion 4lb x15 Prone hip ext 4lb with knee at 90 deg x 15 Manual: Addaday to Rt  hamstring x 5 min  06/06/23: Recumbent bike L2 x3' , L3 x 2' PT present to discuss status Seated HS stretch 2x20" bil Deadlift 10lb x10 -  to mid-shin depth Standing T at counter Rt no weight x10 Supine Rt dynamic HS stretch light blue power cord around foot with TKE x 20 Supine HS curl feet on red ball with bridge x 20 Prone Rt knee flexion 4lb x15 Prone hip ext 4lb with knee at 90 deg x 15 Prone STM - deep stripping to Rt hamstring mid-belly, central and lateral HS Updated HEP   PATIENT EDUCATION:  Education details: [email or web address] Person educated: Patient Education method: Explanation, Demonstration, and Handouts Education comprehension: verbalized understanding and returned demonstration  HOME EXERCISE PROGRAM: Access Code: [email or web address] URL: [email or web address] Date: 06/06/2023 Prepared by: Loistine Simas Axiel Fjeld  Exercises - Seated Hamstring Stretch  - 2 x daily - 7 x weekly - 1 sets - 3 reps - 20-30 hold - Supine Bridge  - 1 x daily - 7 x weekly - 2 sets - 10 reps - Seated Long Arc Quad  - 1 x daily - 7 x weekly - 2 sets - 10 reps - Kettlebell Deadlift  - 1 x daily - 7 x weekly - 1 sets - 10 reps - Forward T  - 1 x daily - 7 x weekly - 1 sets - 10 reps  ASSESSMENT:  CLINICAL IMPRESSION: Pt reports 90% improvement and felt ready to d/c to HEP.  She is now able to stretch in forward trunk bend to palms on floor which was her pre-injury ROM.  She has very local tightness in Rt mid-belly of HS which she continues to stretch through.  She plans to return to gym program next week.  D/C to HEP  OBJECTIVE IMPAIRMENTS: Abnormal gait, decreased activity tolerance, decreased mobility, decreased ROM, decreased strength, increased fascial restrictions, increased muscle spasms, impaired flexibility, improper body mechanics, and pain.   ACTIVITY LIMITATIONS: lifting, bending, sitting, squatting, dressing, and locomotion level  PARTICIPATION LIMITATIONS: cleaning, laundry, community  activity, yard work, and gym workout  PERSONAL FACTORS: 1 comorbidity: history of Rt hip pain/weakness  are also affecting patient's functional outcome.   REHAB POTENTIAL: Excellent  CLINICAL DECISION MAKING: Stable/uncomplicated  EVALUATION COMPLEXITY: Low   GOALS: Goals reviewed with patient? Yes  SHORT TERM GOALS: Target date: 06/24/23 Pt will be ind with initial HEP Baseline: Goal status: met 9/19  2.  Pt will be able to demo standing trunk flexion to distal shin. Baseline: mid-shin with slow cautious movement Goal status: met 9/19  3.  Rt hamstring flexibility with reach at least 70 deg Baseline: 60 deg Goal status: MET 10/3  4.  Pt will be able to perform household tasks requiring modified range bending and squatting with min or no pain  Baseline:  Goal status: IMET 10/3    LONG TERM GOALS: Target date: 07/22/23  Pt will be ind with HEP and return to gym without exacerbation of symptoms and understanding of how to progress. Baseline:  Goal status: MET 10/3  2.  Pt will improve FOTO score to at least 77% to demo improved function. Baseline: 66% Goal status: met 10/3 AT 99%  3.  Pt will demo standing trunk forward bend reaching fingertips to ground with min stretching through Rt hamstring to demo improved ROM and HS flexibility Baseline: fingers to mid shin, able to place flat hands on ground pre-injury Goal status: MET 10/3  4.  Pt will improve Rt knee and hip strength to at least 4+/5 without pain on testing to allow her to return to [employer] machine full circuit workout Baseline:  Goal status: MET 10/3  5.  Pt will report at least 80% improvement in Rt hamstring pain with all household tasks requiring bending and squatting. Baseline:  Goal status: MET 10/3 - 90% BETTER     PLAN:  PT FREQUENCY: 1x/week  PT DURATION: 8 weeks  PLANNED INTERVENTIONS: Therapeutic exercises, Therapeutic activity, Neuromuscular re-education, Balance training,  Patient/Family education, Self Care, Joint mobilization, Stair training, Aquatic Therapy, Dry Needling, Electrical stimulation, Spinal mobilization, Cryotherapy, Moist heat, Taping, Ionotophoresis 4mg /ml Dexamethasone, and Manual therapy  PLAN FOR NEXT SESSION: d/c to HEP   PHYSICAL THERAPY DISCHARGE SUMMARY  Visits from Start of Care: 4  Current functional level related to goals / functional outcomes: See above   Remaining deficits: See above   Education / Equipment: HEP   Patient agrees to discharge. Patient goals were met. Patient is being discharged due to meeting the stated rehab goals.  Arayla Kruschke, PT 06/20/23 12:31 PM

## 2023-06-24 ENCOUNTER — Encounter: Payer: BC Managed Care – PPO | Admitting: Physical Therapy

## 2023-07-01 ENCOUNTER — Encounter: Payer: BC Managed Care – PPO | Admitting: Physical Therapy

## 2023-07-08 ENCOUNTER — Encounter: Payer: BC Managed Care – PPO | Admitting: Physical Therapy

## 2023-07-15 ENCOUNTER — Encounter: Payer: BC Managed Care – PPO | Admitting: Physical Therapy

## 2023-07-22 ENCOUNTER — Ambulatory Visit: Payer: BC Managed Care – PPO

## 2023-10-28 DIAGNOSIS — M25531 Pain in right wrist: Secondary | ICD-10-CM | POA: Diagnosis not present

## 2023-10-28 DIAGNOSIS — Q740 Other congenital malformations of upper limb(s), including shoulder girdle: Secondary | ICD-10-CM | POA: Diagnosis not present

## 2023-10-28 DIAGNOSIS — M25532 Pain in left wrist: Secondary | ICD-10-CM | POA: Diagnosis not present

## 2023-12-20 DIAGNOSIS — Z853 Personal history of malignant neoplasm of breast: Secondary | ICD-10-CM | POA: Diagnosis not present

## 2023-12-20 DIAGNOSIS — N6001 Solitary cyst of right breast: Secondary | ICD-10-CM | POA: Diagnosis not present

## 2023-12-23 ENCOUNTER — Other Ambulatory Visit: Payer: Self-pay | Admitting: Obstetrics and Gynecology

## 2023-12-23 DIAGNOSIS — N6001 Solitary cyst of right breast: Secondary | ICD-10-CM

## 2023-12-30 ENCOUNTER — Ambulatory Visit
Admission: RE | Admit: 2023-12-30 | Discharge: 2023-12-30 | Disposition: A | Discharge status: Home / Self Care | Source: Ambulatory Visit | Attending: Obstetrics and Gynecology | Admitting: Obstetrics and Gynecology

## 2023-12-30 ENCOUNTER — Ambulatory Visit
Admission: RE | Admit: 2023-12-30 | Discharge: 2023-12-30 | Disposition: A | Discharge status: Home / Self Care | Source: Ambulatory Visit | Attending: Obstetrics and Gynecology

## 2023-12-30 DIAGNOSIS — Z853 Personal history of malignant neoplasm of breast: Secondary | ICD-10-CM | POA: Diagnosis not present

## 2023-12-30 DIAGNOSIS — N6315 Unspecified lump in the right breast, overlapping quadrants: Secondary | ICD-10-CM | POA: Diagnosis not present

## 2023-12-30 DIAGNOSIS — N6001 Solitary cyst of right breast: Secondary | ICD-10-CM

## 2024-01-15 NOTE — Progress Notes (Signed)
 [facility] [address] [place] [address] Phone: [phone] Subjective:   Stephanie Malone, am serving as a scribe for Dr. Ronnell Coins.  I'm seeing this patient by the request  of:  [name], Ravisankar, MD  CC: Right hip and left knee pain  [id number]  Stephanie Malone is a 68 y.o. female coming in with complaint of R hip and L foot.  Past medical history significant for breast cancer. Patient states that hip pain began years ago. Pain over GT that occurs with stairs and lying on her side.   Also having L foot pain over top of foot on medial side. Wearing a toe spacer due to developing hallux valgus. Seeing a PT-A at [facility]. Switched shoes and her pain became more irritated.    Reviewing patient's chart has been in formal physical therapy.  Left knee did have x-rays taken in September 2022 showing mild tricompartmental arthritis.  Past Medical History:  Diagnosis Date   Anxiety    Asthma    Breast cancer, IDC, Right, ER+,PR+, HER2- 07/10/2011   Chronic back pain    Degenerative lumbar disc    GERD (gastroesophageal reflux disease)    Migraine    Personal history of radiation therapy    Past Surgical History:  Procedure Laterality Date   ABDOMINAL HYSTERECTOMY     and rectocele repair   BLADDER SUSPENSION     BREAST LUMPECTOMY  08/15/2011   Procedure: BREAST LUMPECTOMY WITH EXCISION OF SENTINEL NODE;  Surgeon: Darcella Earnest, MD;  Location: [facility];  Service: General;  Laterality: Right;   ENDOMETRIAL ABLATION     MASTECTOMY PARTIAL / LUMPECTOMY  08/15/2011   right central with [facility] - Dr Linell Rhymes, right   MOHS  2013   Social History   Socioeconomic History   Marital status: Married    Spouse name: Not on file   Number of children: 3   Years of education: Not on file   Highest education level: Not on file  Occupational History   Occupation: Charity fundraiser- homemaker    Employer: UNEMPLOYED  Tobacco Use   Smoking  status: Former    Current packs/day: 0.00    Average packs/day: 0.3 packs/day for 12.0 years (3.6 ttl pk-yrs)    Types: Cigarettes    Start date: 08/27/1981    Quit date: 08/27/1993    Years since quitting: 30.4   Smokeless tobacco: Never  Substance and Sexual Activity   Alcohol  use: Yes    Comment: wine cooler once "every month maybe"   Drug use: No   Sexual activity: Not on file  Other Topics Concern   Not on file  Social History Narrative   1 son- English as a second language teacher 1 daughter- [employer]        Right handed   Caffeine: 1 large tea per day and sometimes a tea in the afternoon   Social Drivers of Corporate investment banker Strain: Not on file  Food Insecurity: Not on file  Transportation Needs: Not on file  Physical Activity: Not on file  Stress: Not on file  Social Connections: Not on file   No Known Allergies Family History  Problem Relation Age of Onset   Hypertension Mother    Stroke Mother    Heart disease Mother    Diabetes Mother    Atrial fibrillation Mother    Heart attack Mother    Liver cancer Father    Cirrhosis Father  Colon polyps Father    Diabetes Father    Multiple myeloma Paternal Grandmother    Pancreatic cancer Paternal Uncle    Sleep apnea Neg Hx       Current Outpatient Medications (Respiratory):    fexofenadine (ALLEGRA ALLERGY) 180 MG tablet, one po as needed Oral  Current Outpatient Medications (Analgesics):    naproxen sodium (ANAPROX) 220 MG tablet, Take 220 mg by mouth as needed. Reported on 02/01/2016   SUMAtriptan (IMITREX) 100 MG tablet, Take 100 mg by mouth as needed. Reported on 02/01/2016   Current Outpatient Medications (Other):    ALPRAZolam (XANAX) 0.5 MG tablet, Take 0.5 mg by mouth at bedtime as needed.     Melatonin 1 MG CAPS, AS NEEDED FOR SLEEP (Patient not taking: Reported on 05/07/2023)   omeprazole (PRILOSEC) 20 MG capsule, Take 20 mg by mouth daily.   traZODone (DESYREL) 100 MG tablet, Take 50-100  mg by mouth at bedtime as needed.   Reviewed prior external information including notes and imaging from  primary care provider As well as notes that were available from care everywhere and other healthcare systems.  Past medical history, social, surgical and family history all reviewed in electronic medical record.  No pertanent information unless stated regarding to the chief complaint.   Review of Systems:  No headache, visual changes, nausea, vomiting, diarrhea, constipation, dizziness, abdominal pain, skin rash, fevers, chills, night sweats, weight loss, swollen lymph nodes, body aches, joint swelling, chest pain, shortness of breath, mood changes. POSITIVE muscle aches  Objective  There were no vitals taken for this visit.   General: No apparent distress alert and oriented x3 mood and affect normal, dressed appropriately.  HEENT: Pupils equal, extraocular movements intact  Respiratory: Patient's speak in full sentences and does not appear short of breath  Cardiovascular: No lower extremity edema, non tender, no erythema  Right hip exam shows shows patient is tender to palpation over the greater trochanteric area.  Severe overall.  Negative straight leg test.  Patient does have hip abductor weakness noted.  Left foot exam shows patient does have breakdown of the transverse arch noted.  Fairly severe overall.  Patient does have some limited motion noted of the first MTP.  No fairly neutral hindfoot.  Limited muscular skeletal ultrasound was performed and interpreted by Ronnell Coins, M  Hypoechoic changes of the first MTP noted and consistent with a small synovitis.  Otherwise fairly unremarkable with very mild midfoot arthritis. Impression: Very mild midfoot arthritis and hypoechoic changes of the first MTP    After verbal consent patient was prepped with alcohol  swab and with a 21-gauge 2 inch needle injected into the right greater trochanteric area with 2 cc of 0.5% Marcaine  and  1 cc of Kenalog 40 mg/mL.  No blood loss.  Band-Aid placed.  Postinjection instructions given    97110; 15 additional minutes spent for Therapeutic exercises as stated in above notes.  This included exercises focusing on stretching, strengthening, with significant focus on eccentric aspects.   Long term goals include an improvement in range of motion, strength, endurance as well as avoiding reinjury. Patient's frequency would include in 1-2 times a day, 3-5 times a week for a duration of 6-12 weeks. Exercises for the foot include:  Stretches to help lengthen the lower leg and plantar fascia areas Theraband exercises for the lower leg and ankle to help strengthen the surrounding area- dorsiflexion, plantarflexion, inversion, eversion Massage rolling on the plantar surface of the foot with a  frozen bottle, tennis ball or golf ball Towel or marble pick-ups to strengthen the plantar surface of the foot Weight bearing exercises to increase balance and overall stability   Proper technique shown and discussed handout in great detail with ATC.  All questions were discussed and answered.   Impression and Recommendations:    The above documentation has been reviewed and is accurate and complete Malessa Zartman M Seferina Brokaw, DO

## 2024-01-16 ENCOUNTER — Encounter: Payer: Self-pay | Admitting: Family Medicine

## 2024-01-16 ENCOUNTER — Other Ambulatory Visit: Payer: Self-pay

## 2024-01-16 ENCOUNTER — Ambulatory Visit: Admitting: Family Medicine

## 2024-01-16 VITALS — BP 110/78 | HR 67 | Ht 65.5 in | Wt 163.0 lb

## 2024-01-16 DIAGNOSIS — M79672 Pain in left foot: Secondary | ICD-10-CM | POA: Diagnosis not present

## 2024-01-16 DIAGNOSIS — M7061 Trochanteric bursitis, right hip: Secondary | ICD-10-CM

## 2024-01-16 DIAGNOSIS — M7071 Other bursitis of hip, right hip: Secondary | ICD-10-CM | POA: Insufficient documentation

## 2024-01-16 DIAGNOSIS — M216X9 Other acquired deformities of unspecified foot: Secondary | ICD-10-CM | POA: Diagnosis not present

## 2024-01-16 NOTE — Patient Instructions (Addendum)
 I had injection to GT today Spenco Total Support Orthotics Original Transverse arch exercises HOKA recovery See me again in 8 weeks

## 2024-01-16 NOTE — Assessment & Plan Note (Signed)
 Home exercises given.  Discussed icing regimen and home exercises.  Discussed which activities will be beneficial.  Increase activity slowly.  Follow-up with me again in 6 to 8 weeks

## 2024-01-16 NOTE — Assessment & Plan Note (Signed)
 Patient given injection today and tolerated the procedure well.  Differential includes lumbar radiculopathy but less likely.  Discussed icing regimen and home exercises, which activities to do and which ones to avoid.  Increase activity slowly.  Follow-up with me again in 6 to 8 weeks otherwise.

## 2024-02-03 DIAGNOSIS — Z85828 Personal history of other malignant neoplasm of skin: Secondary | ICD-10-CM | POA: Diagnosis not present

## 2024-02-03 DIAGNOSIS — L700 Acne vulgaris: Secondary | ICD-10-CM | POA: Diagnosis not present

## 2024-02-03 DIAGNOSIS — L84 Corns and callosities: Secondary | ICD-10-CM | POA: Diagnosis not present

## 2024-02-03 DIAGNOSIS — L821 Other seborrheic keratosis: Secondary | ICD-10-CM | POA: Diagnosis not present

## 2024-02-22 ENCOUNTER — Other Ambulatory Visit: Payer: Self-pay | Admitting: Internal Medicine

## 2024-02-22 DIAGNOSIS — Z1231 Encounter for screening mammogram for malignant neoplasm of breast: Secondary | ICD-10-CM

## 2024-03-09 NOTE — Progress Notes (Signed)
 [name] [address] [place] [address] Phone: [phone] Subjective:   ISusannah Gully, am serving as a scribe for Dr. Arthea Claudene.  I'm seeing this patient by the request  of:  [name], Ravisankar, MD  CC: Left foot pain and hip pain  YEP:Dlagzrupcz  01/16/2024 Patient given injection today and tolerated the procedure well.  Differential includes lumbar radiculopathy but less likely.  Discussed icing regimen and home exercises, which activities to do and which ones to avoid.  Increase activity slowly.  Follow-up with me again in 6 to 8 weeks otherwise.     03/16/2024 Tamala A Pardoe is a 68 y.o. female coming in with complaint of L foot pain, seems to be more peroneal tendon difficulty as well as breakdown of the transverse arch.  Patient was also found to have more of a greater trochanteric bursitis.  Patient states doing much better. R hip pain coming back around. Pain over GT. Hurts when going up and down stairs.      Past Medical History:  Diagnosis Date   Anxiety    Asthma    Breast cancer, IDC, Right, ER+,PR+, HER2- 07/10/2011   Chronic back pain    Degenerative lumbar disc    GERD (gastroesophageal reflux disease)    Migraine    Personal history of radiation therapy    Past Surgical History:  Procedure Laterality Date   ABDOMINAL HYSTERECTOMY     and rectocele repair   BLADDER SUSPENSION     BREAST LUMPECTOMY  08/15/2011   Procedure: BREAST LUMPECTOMY WITH EXCISION OF SENTINEL NODE;  Surgeon: Sherlean JINNY Laughter, MD;  Location: [facility];  Service: General;  Laterality: Right;   ENDOMETRIAL ABLATION     MASTECTOMY PARTIAL / LUMPECTOMY  08/15/2011   right central with [facility] - Dr Laughter, right   MOHS  2013   Social History   Socioeconomic History   Marital status: Married    Spouse name: Not on file   Number of children: 3   Years of education: Not on file   Highest education level: Not on file  Occupational  History   Occupation: Charity fundraiser- homemaker    Employer: UNEMPLOYED  Tobacco Use   Smoking status: Former    Current packs/day: 0.00    Average packs/day: 0.3 packs/day for 12.0 years (3.6 ttl pk-yrs)    Types: Cigarettes    Start date: 08/27/1981    Quit date: 08/27/1993    Years since quitting: 30.5   Smokeless tobacco: Never  Substance and Sexual Activity   Alcohol  use: Yes    Comment: wine cooler once every month maybe   Drug use: No   Sexual activity: Not on file  Other Topics Concern   Not on file  Social History Narrative   1 son- English as a second language teacher 1 daughter- [place]       Right handed   Caffeine: 1 large tea per day and sometimes a tea in the afternoon   Social Drivers of Corporate investment banker Strain: Not on file  Food Insecurity: Not on file  Transportation Needs: Not on file  Physical Activity: Not on file  Stress: Not on file  Social Connections: Not on file   No Known Allergies Family History  Problem Relation Age of Onset   Hypertension Mother    Stroke Mother    Heart disease Mother    Diabetes Mother    Atrial fibrillation Mother    Heart  attack Mother    Liver cancer Father    Cirrhosis Father    Colon polyps Father    Diabetes Father    Multiple myeloma Paternal Grandmother    Pancreatic cancer Paternal Uncle    Sleep apnea Neg Hx       Current Outpatient Medications (Respiratory):    fexofenadine (ALLEGRA ALLERGY) 180 MG tablet, one po as needed Oral  Current Outpatient Medications (Analgesics):    naproxen sodium (ANAPROX) 220 MG tablet, Take 220 mg by mouth as needed. Reported on 02/01/2016   SUMAtriptan (IMITREX) 100 MG tablet, Take 100 mg by mouth as needed. Reported on 02/01/2016   Current Outpatient Medications (Other):    ALPRAZolam (XANAX) 0.5 MG tablet, Take 0.5 mg by mouth at bedtime as needed.     Melatonin 1 MG CAPS,    omeprazole (PRILOSEC) 20 MG capsule, Take 20 mg by mouth daily.   traZODone (DESYREL) 100  MG tablet, Take 50-100 mg by mouth at bedtime as needed.   Reviewed prior external information including notes and imaging from  primary care provider As well as notes that were available from care everywhere and other healthcare systems.  Past medical history, social, surgical and family history all reviewed in electronic medical record.  No pertanent information unless stated regarding to the chief complaint.   Review of Systems:  No headache, visual changes, nausea, vomiting, diarrhea, constipation, dizziness, abdominal pain, skin rash, fevers, chills, night sweats, weight loss, swollen lymph nodes, body aches, joint swelling, chest pain, shortness of breath, mood changes. POSITIVE muscle aches  Objective  There were no vitals taken for this visit.   General: No apparent distress alert and oriented x3 mood and affect normal, dressed appropriately.  HEENT: Pupils equal, extraocular movements intact  Respiratory: Patient's speak in full sentences and does not appear short of breath  Cardiovascular: No lower extremity edema, non tender, no erythema  Foot exam shows nontender on exam.  Hip exam shows right hip does have some tenderness noted.  Fairly severe overall.  Still seems to be over the greater trochanteric area.  Tightness with FABER.  Negative straight leg test noted.   After verbal consent patient was prepped with alcohol  swab and with a 21-gauge 2 inch needle injected into the right greater trochanteric area with 2 cc of 0.5% Marcaine  and 1 cc of Kenalog 40 mg/mL.  No blood loss.  Band-Aid placed.  Postinjection instructions given   Impression and Recommendations:    The above documentation has been reviewed and is accurate and complete Amen Dargis M Neil Errickson, DO

## 2024-03-16 ENCOUNTER — Ambulatory Visit: Admitting: Family Medicine

## 2024-03-16 ENCOUNTER — Encounter: Payer: Self-pay | Admitting: Family Medicine

## 2024-03-16 ENCOUNTER — Ambulatory Visit (INDEPENDENT_AMBULATORY_CARE_PROVIDER_SITE_OTHER)

## 2024-03-16 VITALS — BP 124/72 | HR 80 | Ht 65.5 in | Wt 177.0 lb

## 2024-03-16 DIAGNOSIS — M7061 Trochanteric bursitis, right hip: Secondary | ICD-10-CM

## 2024-03-16 DIAGNOSIS — M47816 Spondylosis without myelopathy or radiculopathy, lumbar region: Secondary | ICD-10-CM | POA: Diagnosis not present

## 2024-03-16 DIAGNOSIS — M4807 Spinal stenosis, lumbosacral region: Secondary | ICD-10-CM | POA: Diagnosis not present

## 2024-03-16 NOTE — Assessment & Plan Note (Addendum)
 Patient given injection and tolerated the procedure well, discussed icing regimen of home exercises, discussed which activities to do in which ones to avoid.  Increase activity slowly.  Discussed icing regimen.  Follow-up again in 6 to 8 weeks if continuing to have pain I do think that further imaging of the lumbar and hip is warranted.  Patient has already done some conservative therapy.

## 2024-03-16 NOTE — Patient Instructions (Addendum)
 Xray today Write me in 2-3 weeks to let me know how you're doing See you again in 2-3 months

## 2024-03-22 ENCOUNTER — Ambulatory Visit: Payer: Self-pay | Admitting: Family Medicine

## 2024-03-24 DIAGNOSIS — E785 Hyperlipidemia, unspecified: Secondary | ICD-10-CM | POA: Diagnosis not present

## 2024-03-31 DIAGNOSIS — K2270 Barrett's esophagus without dysplasia: Secondary | ICD-10-CM | POA: Diagnosis not present

## 2024-03-31 DIAGNOSIS — R82998 Other abnormal findings in urine: Secondary | ICD-10-CM | POA: Diagnosis not present

## 2024-04-02 ENCOUNTER — Ambulatory Visit
Admission: RE | Admit: 2024-04-02 | Discharge: 2024-04-02 | Disposition: A | Discharge status: Home / Self Care | Source: Ambulatory Visit | Attending: Internal Medicine | Admitting: Internal Medicine

## 2024-04-02 DIAGNOSIS — Z1231 Encounter for screening mammogram for malignant neoplasm of breast: Secondary | ICD-10-CM

## 2024-04-09 DIAGNOSIS — Z1331 Encounter for screening for depression: Secondary | ICD-10-CM | POA: Diagnosis not present

## 2024-04-09 DIAGNOSIS — Z0000 Encounter for general adult medical examination without abnormal findings: Secondary | ICD-10-CM | POA: Diagnosis not present

## 2024-04-09 DIAGNOSIS — Z23 Encounter for immunization: Secondary | ICD-10-CM | POA: Diagnosis not present

## 2024-04-09 DIAGNOSIS — Z1339 Encounter for screening examination for other mental health and behavioral disorders: Secondary | ICD-10-CM | POA: Diagnosis not present

## 2024-05-14 NOTE — Progress Notes (Signed)
 [facility] [address] [place] [address] Phone: [phone] Subjective:   [name], am serving as a scribe for Dr. Arthea Claudene.  I'm seeing this patient by the request  of:  [name], Ravisankar, MD  CC: hip pain   YEP:Dlagzrupcz  03/16/2024 Patient given injection and tolerated the procedure well, discussed icing regimen of home exercises, discussed which activities to do in which ones to avoid.  Increase activity slowly.  Discussed icing regimen.  Follow-up again in 6 to 8 weeks if continuing to have pain I do think that further imaging of the lumbar and hip is warranted.  Patient has already done some conservative therapy.     Updated 05/19/2024 Stephanie Malone is a 68 y.o. female coming in with complaint of hip pain. Patient states that hip was doing great but in past 2-3 weeks her pain started to return. Pain is not as bad as it initially was when she first came to us . Others are noticing she has antalgic gait.        Past Medical History:  Diagnosis Date   Anxiety    Asthma    Breast cancer, IDC, Right, ER+,PR+, HER2- 07/10/2011   Chronic back pain    Degenerative lumbar disc    GERD (gastroesophageal reflux disease)    Migraine    Personal history of radiation therapy    Past Surgical History:  Procedure Laterality Date   ABDOMINAL HYSTERECTOMY     and rectocele repair   BLADDER SUSPENSION     BREAST LUMPECTOMY  08/15/2011   Procedure: BREAST LUMPECTOMY WITH EXCISION OF SENTINEL NODE;  Surgeon: Sherlean JINNY Laughter, MD;  Location: [facility];  Service: General;  Laterality: Right;   ENDOMETRIAL ABLATION     MOHS  2013   Social History   Socioeconomic History   Marital status: Married    Spouse name: Not on file   Number of children: 3   Years of education: Not on file   Highest education level: Not on file  Occupational History   Occupation: Charity fundraiser- homemaker    Employer: UNEMPLOYED  Tobacco Use   Smoking  status: Former    Current packs/day: 0.00    Average packs/day: 0.3 packs/day for 12.0 years (3.6 ttl pk-yrs)    Types: Cigarettes    Start date: 08/27/1981    Quit date: 08/27/1993    Years since quitting: 30.7   Smokeless tobacco: Never  Substance and Sexual Activity   Alcohol  use: Yes    Comment: wine cooler once every month maybe   Drug use: No   Sexual activity: Not on file  Other Topics Concern   Not on file  Social History Narrative   1 son- English as a second language teacher 1 daughter- [employer]       Right handed   Caffeine: 1 large tea per day and sometimes a tea in the afternoon   Social Drivers of Corporate investment banker Strain: Not on file  Food Insecurity: Not on file  Transportation Needs: Not on file  Physical Activity: Not on file  Stress: Not on file  Social Connections: Not on file   No Known Allergies Family History  Problem Relation Age of Onset   Hypertension Mother    Stroke Mother    Heart disease Mother    Diabetes Mother    Atrial fibrillation Mother    Heart attack Mother    Liver cancer Father    Cirrhosis  Father    Colon polyps Father    Diabetes Father    Multiple myeloma Paternal Grandmother    Pancreatic cancer Paternal Uncle    Sleep apnea Neg Hx       Current Outpatient Medications (Respiratory):    fexofenadine (ALLEGRA ALLERGY) 180 MG tablet, one po as needed Oral  Current Outpatient Medications (Analgesics):    naproxen sodium (ANAPROX) 220 MG tablet, Take 220 mg by mouth as needed. Reported on 02/01/2016   SUMAtriptan (IMITREX) 100 MG tablet, Take 100 mg by mouth as needed. Reported on 02/01/2016   Current Outpatient Medications (Other):    ALPRAZolam (XANAX) 0.5 MG tablet, Take 0.5 mg by mouth at bedtime as needed.     Melatonin 1 MG CAPS,    omeprazole (PRILOSEC) 20 MG capsule, Take 20 mg by mouth daily.   traZODone (DESYREL) 100 MG tablet, Take 50-100 mg by mouth at bedtime as needed.   Reviewed prior external  information including notes and imaging from  primary care provider As well as notes that were available from care everywhere and other healthcare systems.  Past medical history, social, surgical and family history all reviewed in electronic medical record.  No pertanent information unless stated regarding to the chief complaint.   Review of Systems:  No headache, visual changes, nausea, vomiting, diarrhea, constipation, dizziness, abdominal pain, skin rash, fevers, chills, night sweats, weight loss, swollen lymph nodes, body aches, joint swelling, chest pain, shortness of breath, mood changes. POSITIVE muscle aches  Objective  Blood pressure 106/72, pulse 92, height 5' 5.5 (1.664 m), weight 173 lb (78.5 kg), SpO2 94%.   General: No apparent distress alert and oriented x3 mood and affect normal, dressed appropriately.  HEENT: Pupils equal, extraocular movements intact  Respiratory: Patient's speak in full sentences and does not appear short of breath  Cardiovascular: No lower extremity edema, non tender, no erythema  Back exam patient does have some mild loss of lordosis.  Some tightness still noted on the right greater trochanteric area in the right gluteal area.  Mild positive FABER test noted on the right side.  Osteopathic findings T9 extended rotated and side bent left L2 flexed rotated and side bent right L5 flexed rotated and side bent right Sacrum right on right     Impression and Recommendations:  Greater trochanteric pain syndrome Discussed icing regimen and home exercises, which activities to do and which ones to avoid.  Increase activity slowly.  Discussed icing regimen.  Patient wanted to try osteopathic manipulation and responded extremely well.  Discussed the potential for also still different therapy such as dry needling.  Follow-up again in 6 to 8 weeks    Decision today to treat with OMT was based on Physical Exam  After verbal consent patient was treated with  HVLA, ME, FPR techniques in  thoracic,  lumbar and sacral areas, all areas are chronic   Patient tolerated the procedure well with improvement in symptoms  Patient given exercises, stretches and lifestyle modifications  See medications in patient instructions if given  Patient will follow up in 6-8 weeks  The above documentation has been reviewed and is accurate and complete Liesel Peckenpaugh M Briseidy Spark, DO

## 2024-05-19 ENCOUNTER — Encounter: Payer: Self-pay | Admitting: Family Medicine

## 2024-05-19 ENCOUNTER — Ambulatory Visit (INDEPENDENT_AMBULATORY_CARE_PROVIDER_SITE_OTHER): Admitting: Family Medicine

## 2024-05-19 VITALS — BP 106/72 | HR 92 | Ht 65.5 in | Wt 173.0 lb

## 2024-05-19 DIAGNOSIS — M9903 Segmental and somatic dysfunction of lumbar region: Secondary | ICD-10-CM | POA: Diagnosis not present

## 2024-05-19 DIAGNOSIS — M9904 Segmental and somatic dysfunction of sacral region: Secondary | ICD-10-CM

## 2024-05-19 DIAGNOSIS — M9902 Segmental and somatic dysfunction of thoracic region: Secondary | ICD-10-CM | POA: Diagnosis not present

## 2024-05-19 DIAGNOSIS — M25559 Pain in unspecified hip: Secondary | ICD-10-CM

## 2024-05-19 NOTE — Patient Instructions (Signed)
 SI jt exercises See me in 6-8 weeks

## 2024-05-19 NOTE — Assessment & Plan Note (Signed)
 Discussed icing regimen and home exercises, which activities to do and which ones to avoid.  Increase activity slowly.  Discussed icing regimen.  Patient wanted to try osteopathic manipulation and responded extremely well.  Discussed the potential for also still different therapy such as dry needling.  Follow-up again in 6 to 8 weeks

## 2024-06-18 ENCOUNTER — Other Ambulatory Visit: Payer: BC Managed Care – PPO

## 2024-06-18 ENCOUNTER — Ambulatory Visit (HOSPITAL_BASED_OUTPATIENT_CLINIC_OR_DEPARTMENT_OTHER)
Admission: RE | Admit: 2024-06-18 | Discharge: 2024-06-18 | Disposition: A | Discharge status: Home / Self Care | Source: Ambulatory Visit | Attending: Obstetrics and Gynecology | Admitting: Obstetrics and Gynecology

## 2024-06-18 DIAGNOSIS — Z780 Asymptomatic menopausal state: Secondary | ICD-10-CM | POA: Diagnosis not present

## 2024-06-18 DIAGNOSIS — Z13820 Encounter for screening for osteoporosis: Secondary | ICD-10-CM | POA: Diagnosis not present

## 2024-06-30 ENCOUNTER — Ambulatory Visit: Admitting: Family Medicine

## 2024-08-10 NOTE — Progress Notes (Deleted)
  [facility] [address] [place] [address] Phone: [phone] Subjective:    I'm seeing this patient by the request  of:  [name], Ravisankar, MD  CC:   YEP:Dlagzrupcz  Stephanie Malone is a 68 y.o. female coming in with complaint of back and neck pain. OMT on 05/19/2024. Patient states   Medications patient has been prescribed:   Taking:         Reviewed prior external information including notes and imaging from previsou exam, outside providers and external EMR if available.   As well as notes that were available from care everywhere and other healthcare systems.  Past medical history, social, surgical and family history all reviewed in electronic medical record.  No pertanent information unless stated regarding to the chief complaint.   Past Medical History:  Diagnosis Date   Anxiety    Asthma    Breast cancer, IDC, Right, ER+,PR+, HER2- 07/10/2011   Chronic back pain    Degenerative lumbar disc    GERD (gastroesophageal reflux disease)    Migraine    Personal history of radiation therapy     No Known Allergies   Review of Systems:  No headache, visual changes, nausea, vomiting, diarrhea, constipation, dizziness, abdominal pain, skin rash, fevers, chills, night sweats, weight loss, swollen lymph nodes, body aches, joint swelling, chest pain, shortness of breath, mood changes. POSITIVE muscle aches  Objective  There were no vitals taken for this visit.   General: No apparent distress alert and oriented x3 mood and affect normal, dressed appropriately.  HEENT: Pupils equal, extraocular movements intact  Respiratory: Patient's speak in full sentences and does not appear short of breath  Cardiovascular: No lower extremity edema, non tender, no erythema  Gait MSK:  Back   Osteopathic findings  C2 flexed rotated and side bent right C6 flexed rotated and side bent left T3 extended rotated and side bent right inhaled rib T9  extended rotated and side bent left L2 flexed rotated and side bent right Sacrum right on right       Assessment and Plan:  No problem-specific Assessment & Plan notes found for this encounter.    Nonallopathic problems  Decision today to treat with OMT was based on Physical Exam  After verbal consent patient was treated with HVLA, ME, FPR techniques in cervical, rib, thoracic, lumbar, and sacral  areas  Patient tolerated the procedure well with improvement in symptoms  Patient given exercises, stretches and lifestyle modifications  See medications in patient instructions if given  Patient will follow up in 4-8 weeks             Note: This dictation was prepared with Dragon dictation along with smaller phrase technology. Any transcriptional errors that result from this process are unintentional.

## 2024-08-17 ENCOUNTER — Ambulatory Visit: Admitting: Family Medicine

## 2024-08-25 DIAGNOSIS — S82831A Other fracture of upper and lower end of right fibula, initial encounter for closed fracture: Secondary | ICD-10-CM | POA: Diagnosis not present

## 2024-09-08 DIAGNOSIS — M19171 Post-traumatic osteoarthritis, right ankle and foot: Secondary | ICD-10-CM | POA: Diagnosis not present

## 2024-09-08 DIAGNOSIS — S82831A Other fracture of upper and lower end of right fibula, initial encounter for closed fracture: Secondary | ICD-10-CM | POA: Diagnosis not present

## 2024-10-22 ENCOUNTER — Ambulatory Visit: Admitting: Family Medicine
